# Patient Record
Sex: Female | Born: 1960 | Hispanic: Yes | Marital: Married | Smoking: Never smoker
Health system: Southern US, Community
[De-identification: ages and names within clinical notes are randomized; demographics above are authoritative.]

## PROBLEM LIST (undated history)

## (undated) DIAGNOSIS — G629 Polyneuropathy, unspecified: Secondary | ICD-10-CM

## (undated) DIAGNOSIS — E785 Hyperlipidemia, unspecified: Secondary | ICD-10-CM

## (undated) DIAGNOSIS — I10 Essential (primary) hypertension: Secondary | ICD-10-CM

## (undated) DIAGNOSIS — M8580 Other specified disorders of bone density and structure, unspecified site: Secondary | ICD-10-CM

## (undated) DIAGNOSIS — R87619 Unspecified abnormal cytological findings in specimens from cervix uteri: Secondary | ICD-10-CM

## (undated) DIAGNOSIS — G4730 Sleep apnea, unspecified: Secondary | ICD-10-CM

## (undated) DIAGNOSIS — R32 Unspecified urinary incontinence: Secondary | ICD-10-CM

## (undated) DIAGNOSIS — E119 Type 2 diabetes mellitus without complications: Secondary | ICD-10-CM

## (undated) DIAGNOSIS — A64 Unspecified sexually transmitted disease: Secondary | ICD-10-CM

## (undated) HISTORY — DX: Type 2 diabetes mellitus without complications: E11.9

## (undated) HISTORY — DX: Hyperlipidemia, unspecified: E78.5

## (undated) HISTORY — DX: Unspecified sexually transmitted disease: A64

## (undated) HISTORY — DX: Sleep apnea, unspecified: G47.30

## (undated) HISTORY — DX: Essential (primary) hypertension: I10

## (undated) HISTORY — PX: CERVICAL FUSION: SHX112

## (undated) HISTORY — PX: BREAST SURGERY: SHX581

## (undated) HISTORY — DX: Unspecified urinary incontinence: R32

## (undated) HISTORY — DX: Polyneuropathy, unspecified: G62.9

## (undated) HISTORY — DX: Unspecified abnormal cytological findings in specimens from cervix uteri: R87.619

## (undated) HISTORY — DX: Other specified disorders of bone density and structure, unspecified site: M85.80

---

## 1989-03-18 DIAGNOSIS — R87619 Unspecified abnormal cytological findings in specimens from cervix uteri: Secondary | ICD-10-CM

## 1989-03-18 HISTORY — PX: CERVICAL BIOPSY  W/ LOOP ELECTRODE EXCISION: SUR135

## 1989-03-18 HISTORY — DX: Unspecified abnormal cytological findings in specimens from cervix uteri: R87.619

## 1990-03-18 HISTORY — PX: BREAST REDUCTION SURGERY: SHX8

## 2000-03-18 HISTORY — PX: CHOLECYSTECTOMY: SHX55

## 2010-03-18 HISTORY — PX: COLONOSCOPY: SHX174

## 2012-09-15 HISTORY — PX: FOOT SURGERY: SHX648

## 2013-03-30 ENCOUNTER — Encounter: Payer: Self-pay | Admitting: Gynecology

## 2013-03-31 ENCOUNTER — Ambulatory Visit: Payer: Self-pay | Admitting: Gynecology

## 2013-03-31 ENCOUNTER — Encounter: Payer: Self-pay | Admitting: Gynecology

## 2014-01-12 ENCOUNTER — Telehealth: Payer: Self-pay | Admitting: Gynecology

## 2014-01-12 NOTE — Telephone Encounter (Signed)
LMTCB about canceled appointment with Dr. Charlies Constable.

## 2014-01-17 ENCOUNTER — Encounter: Payer: Self-pay | Admitting: Gynecology

## 2014-01-18 ENCOUNTER — Ambulatory Visit: Payer: 59 | Admitting: Gynecology

## 2014-01-21 ENCOUNTER — Encounter: Payer: Self-pay | Admitting: Obstetrics and Gynecology

## 2014-01-21 ENCOUNTER — Ambulatory Visit (INDEPENDENT_AMBULATORY_CARE_PROVIDER_SITE_OTHER): Payer: Managed Care, Other (non HMO) | Admitting: Obstetrics and Gynecology

## 2014-01-21 VITALS — BP 120/70 | HR 100 | Resp 20 | Ht 66.0 in | Wt 240.2 lb

## 2014-01-21 DIAGNOSIS — Z01419 Encounter for gynecological examination (general) (routine) without abnormal findings: Secondary | ICD-10-CM

## 2014-01-21 DIAGNOSIS — Z0000 Encounter for general adult medical examination without abnormal findings: Secondary | ICD-10-CM

## 2014-01-21 LAB — POCT URINALYSIS DIPSTICK
BILIRUBIN UA: NEGATIVE
Blood, UA: NEGATIVE
GLUCOSE UA: NEGATIVE
KETONES UA: NEGATIVE
LEUKOCYTES UA: NEGATIVE
NITRITE UA: NEGATIVE
PH UA: 5
Protein, UA: NEGATIVE
Urobilinogen, UA: NEGATIVE

## 2014-01-21 NOTE — Patient Instructions (Signed)

## 2014-01-21 NOTE — Progress Notes (Signed)
Patient ID: Donna Krause, female   DOB: 1960-05-06, 53 y.o.   MRN: [id number] 53 y.o. [id number] MarriedCaucasianF here for annual exam.   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  Has one son.  Patient is concerned about her history of condyloma and history of LEEP procedure.  Follow up paps are normal.   Has some minor leakage without warning no real stress incontinence.  Declines medication.   Hot flashes have ended.   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   PCP:  Janie Morning, MD  Patient's last menstrual period was 03/19/2007 (approximate).          Sexually active: No. female The current method of family planning is postmenopausal.    Exercising: Yes.    walking. Smoker:  no  Health Maintenance: Pap:  1-0-14 wnl:neg HR HPV History of abnormal Pap:  Yes, 1991 hx of LEEP procedure. MMG: 01-17-14 [facility]:results pending  Colonoscopy:  2010 normal in [place].  Next colonoscopy due 2020. BMD:   09/2012 revealed Osteopenia with PCP in [place].   TDaP:  Up to date with PCP Screening Labs:  Hb today: PCP, Urine today: Neg   reports that she has never smoked. She does not have any smokeless tobacco history on file. She reports that she does not drink alcohol or use illicit drugs.  Past Medical History  Diagnosis Date  . Diabetes mellitus without complication   . Sleep apnea     wears cpap  . Hypertension   . Hyperlipidemia   . Osteopenia   . STD (sexually transmitted disease)     hx. HSV and condyloma/HPV  . Abnormal Pap smear of cervix 1991    hx of LEEP procedure to cervix    Past Surgical History  Procedure Laterality Date  . Cholecystectomy  2002  . Cervical fusion    . Breast reduction surgery  1992  . Breast surgery      breast reduction  . Cervical biopsy  w/ loop electrode excision  1991  . Foot surgery  09/2012    right 5th metatarsal    Current Outpatient Prescriptions  Medication Sig Dispense Refill  . Atorvastatin Calcium (LIPITOR PO)  Take 5 mg by mouth daily.    Marland Kitchen aspirin 81 MG tablet Take 81 mg by mouth daily.    . famotidine (PEPCID) 20 MG tablet Take 20 mg by mouth 2 (two) times daily.    Marland Kitchen LANTUS SOLOSTAR 100 UNIT/ML Solostar Pen Inject 44 Units as directed at bedtime.    Marland Kitchen lisinopril (PRINIVIL,ZESTRIL) 10 MG tablet Take 10 mg by mouth daily.    . metFORMIN (GLUCOPHAGE) 1000 MG tablet Take 1,000 mg by mouth 2 (two) times daily with a meal.    . NOVOFINE 32G X 6 MM MISC     . VICTOZA 18 MG/3ML SOPN Inject 1.8 mg as directed at bedtime.     No current facility-administered medications for this visit.    Family History  Problem Relation Age of Onset  . Thyroid disease Mother   . Diabetes Father     Type 2   . Heart attack Father 70  . Heart failure Father   . Rheumatic fever Brother     ROS:  Pertinent items are noted in HPI.  Otherwise, a comprehensive ROS was negative.  Exam:   BP 120/70 mmHg  Pulse 100  Resp 20  Ht 5\' 6"  (1.676 m)  Wt 240 lb 3.2 oz (108.954 kg)  BMI 38.79 kg/m2  LMP 03/19/2007 (  Approximate)     Height: 5\' 6"  (167.6 cm)  Ht Readings from Last 3 Encounters:  01/21/14 5\' 6"  (1.676 m)    General appearance: alert, cooperative and appears stated age Head: Normocephalic, without obvious abnormality, atraumatic Neck: no adenopathy, supple, symmetrical, trachea midline and thyroid normal to inspection and palpation Lungs: clear to auscultation bilaterally Breasts: normal appearance, no masses or tenderness Heart: regular rate and rhythm Abdomen: central obesity, soft, non-tender; bowel sounds normal; no masses,  no organomegaly Extremities: extremities normal, atraumatic, no cyanosis or edema Skin: Skin color, texture, turgor normal. No rashes or lesions Lymph nodes: Cervical, supraclavicular, and axillary nodes normal. No abnormal inguinal nodes palpated Neurologic: Grossly normal   Pelvic: External genitalia:  no lesions              Urethra:  normal appearing urethra with no  masses, tenderness or lesions              Bartholins and Skenes: normal                 Vagina: normal appearing vagina with normal color and discharge, no lesions              Cervix: no lesions              Pap taken: Yes.   Bimanual Exam:  Uterus:  normal size, contour, position, consistency, mobility, non-tender              Adnexa: normal adnexa and no mass, fullness, tenderness               Rectovaginal: Confirms               Anus:  normal sphincter tone, no lesions  A:  Well Woman with normal exam History of condyloma and LEEP. Osteopenia.  Urinary incontinence. Possible overactive bladder.  P:   Mammogram yearly. pap smear and HR HPV performed. Discussed Ca/Vit D. Declines antimuscarinic or anticholinergic medication for bladder control.  return annually or prn  An After Visit Summary was printed and given to the patient.

## 2014-01-26 LAB — IPS PAP TEST WITH HPV

## 2015-03-07 ENCOUNTER — Encounter: Payer: Self-pay | Admitting: Obstetrics and Gynecology

## 2015-03-07 ENCOUNTER — Ambulatory Visit (INDEPENDENT_AMBULATORY_CARE_PROVIDER_SITE_OTHER): Payer: BLUE CROSS/BLUE SHIELD | Admitting: Obstetrics and Gynecology

## 2015-03-07 VITALS — BP 110/72 | HR 80 | Resp 14 | Ht 66.0 in | Wt 243.8 lb

## 2015-03-07 DIAGNOSIS — B372 Candidiasis of skin and nail: Secondary | ICD-10-CM

## 2015-03-07 DIAGNOSIS — R319 Hematuria, unspecified: Secondary | ICD-10-CM

## 2015-03-07 DIAGNOSIS — Z01419 Encounter for gynecological examination (general) (routine) without abnormal findings: Secondary | ICD-10-CM | POA: Diagnosis not present

## 2015-03-07 DIAGNOSIS — Z0000 Encounter for general adult medical examination without abnormal findings: Secondary | ICD-10-CM | POA: Diagnosis not present

## 2015-03-07 DIAGNOSIS — N3281 Overactive bladder: Secondary | ICD-10-CM | POA: Diagnosis not present

## 2015-03-07 MED ORDER — OXYBUTYNIN CHLORIDE ER 10 MG PO TB24: 10.0000 mg | ORAL_TABLET | Freq: Every day | ORAL | Status: DC

## 2015-03-07 MED ORDER — FLUCONAZOLE 150 MG PO TABS: 150.0000 mg | ORAL_TABLET | Freq: Once | ORAL | Status: DC

## 2015-03-07 MED ORDER — NYSTATIN 100000 UNIT/GM EX POWD: Freq: Three times a day (TID) | CUTANEOUS | Status: DC

## 2015-03-07 NOTE — Progress Notes (Addendum)
Patient ID: Donna Krause, female   DOB: 10-Dec-1960, 54 y.o.   MRN: [id number] 54 y.o. [id number] Married Caucasian female here for annual exam.    Struggling with weight.  Stress eating.  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  101 year old son.   Wants pap today.    Patient does complain of urinary frequency and urinary incontinence.  Wears a pad. Voiding twice per night.  No stress incontinence.  Coffee - 16 ounces per am.   She also states she has a possible yeast rash in groin area and request prescription cream for this. Using antifungal creams.   No glaucoma.   PCP:  Janie Morning, MD   Patient's last menstrual period was 03/19/2007 (approximate).          Sexually active: No. female The current method of family planning is post menopausal status.    Exercising: No.   Smoker:  no  Health Maintenance: Pap:  01-24-14 Neg:Neg HR HPV History of abnormal Pap:  Yes, 1991 hx LEEP procedure. MMG:  03-01-15 normal per patient:[facility]. Colonoscopy:  2010 normal in [place] due 2020. BMD:   09/2012  Result  Osteopenia with PCP.  Mild per patient.  TDaP:  PCP Screening Labs:  Hb today: PCP, Urine today:  Trace RBCs.   reports that she has never smoked. She does not have any smokeless tobacco history on file. She reports that she does not drink alcohol or use illicit drugs.  Past Medical History  Diagnosis Date  . Diabetes mellitus without complication ([place])   . Sleep apnea     wears cpap  . Hypertension   . Hyperlipidemia   . Osteopenia   . STD (sexually transmitted disease)     hx. HSV and condyloma/HPV  . Abnormal Pap smear of cervix 1991    hx of LEEP procedure to cervix  . Urinary incontinence     Past Surgical History  Procedure Laterality Date  . Cholecystectomy  2002  . Cervical fusion    . Breast reduction surgery  1992  . Breast surgery      breast reduction  . Cervical biopsy  w/ loop electrode excision  1991  . Foot  surgery  09/2012    right 5th metatarsal    Current Outpatient Prescriptions  Medication Sig Dispense Refill  . aspirin 81 MG tablet Take 81 mg by mouth daily.    . famotidine (PEPCID) 20 MG tablet Take 20 mg by mouth 2 (two) times daily.    Marland Kitchen LANTUS SOLOSTAR 100 UNIT/ML Solostar Pen Inject 44 Units as directed at bedtime.    Marland Kitchen lisinopril (PRINIVIL,ZESTRIL) 10 MG tablet Take 10 mg by mouth daily.    Marland Kitchen NOVOFINE 32G X 6 MM MISC     . VICTOZA 18 MG/3ML SOPN Inject 1.8 mg as directed at bedtime.    . metFORMIN (GLUCOPHAGE) 500 MG tablet Take 2 tablets by mouth 2 (two) times daily.    . ONE TOUCH ULTRA TEST test strip     . rosuvastatin (CRESTOR) 10 MG tablet Take 1 tablet by mouth daily.     No current facility-administered medications for this visit.    Family History  Problem Relation Age of Onset  . Thyroid disease Mother   . Diabetes Father     Type 2   . Heart attack Father 61  . Heart failure Father   . Rheumatic fever Brother     ROS:  Pertinent items are noted in  HPI.  Otherwise, a comprehensive ROS was negative.  Exam:   BP 110/72 mmHg  Pulse 80  Resp 14  Ht 5\' 6"  (1.676 m)  Wt 243 lb 12.8 oz (110.587 kg)  BMI 39.37 kg/m2  LMP 03/19/2007 (Approximate)    General appearance: alert, cooperative and appears stated age Head: Normocephalic, without obvious abnormality, atraumatic Neck: no adenopathy, supple, symmetrical, trachea midline and thyroid normal to inspection and palpation Lungs: clear to auscultation bilaterally Breasts: normal appearance, no masses or tenderness, Inspection negative, No nipple retraction or dimpling, No nipple discharge or bleeding, No axillary or supraclavicular adenopathy.  Scars consistent with bilateral breast reduction.  Heart: regular rate and rhythm Abdomen: central obesity, soft, non-tender; bowel sounds normal; no masses,  no organomegaly Extremities: extremities normal, atraumatic, no cyanosis or edema Skin: Skin color, texture,  turgor normal. No rashes or lesions Lymph nodes: Cervical, supraclavicular, and axillary nodes normal. No abnormal inguinal nodes palpated Neurologic: Grossly normal  Pelvic: External genitalia:  Left groin flexural skin with creamy drainage and mild whitish change of the skin.               Urethra:  normal appearing urethra with no masses, tenderness or lesions              Bartholins and Skenes: normal                 Vagina: normal appearing vagina with normal color and discharge, no lesions              Cervix: no lesions              Pap taken: Yes.   Bimanual Exam:  Uterus:  normal size, contour, position, consistency, mobility, non-tender              Adnexa: normal adnexa and no mass, fullness, tenderness              Rectovaginal: Yes.  .  Confirms.              Anus:  normal sphincter tone, no lesions  Chaperone was present for exam.  Assessment:   Well woman visit with normal exam. History of condyloma and LEEP. Osteopenia.  Urinary incontinence. Overactive bladder. Microscopic hematuria.  Candida of flexural skin.  Plan: Yearly mammogram recommended after age 75.  Recommended self breast exam.  Pap and HR HPV as above. Discussed Calcium, Vitamin D, regular exercise program including cardiovascular and weight bearing exercise. Labs performed.  No..   Urine micro and culture. Refills given on medications.  Yes.  .  See orders.  Ditropan XL 10 mg daily. #30, RF one.  Discussed side effects of dry mouth, constipation, and urinary retention.  Patient will report back how she is doing with the medication, and we will then send in refills to her mail order pharmacy.  Diflucan 150 mg. #2, RF none.  Nystatin powder with refills.  Encouraged time for herself.  Shared ideas about tasking others with responsibilities. Follow up annually and prn.      After visit summary provided.

## 2015-03-07 NOTE — Patient Instructions (Signed)

## 2015-03-08 LAB — URINALYSIS, MICROSCOPIC ONLY
BACTERIA UA: NONE SEEN [HPF]
Casts: NONE SEEN [LPF]
Crystals: NONE SEEN [HPF]
RBC / HPF: NONE SEEN RBC/HPF (ref <=2)
WBC UA: NONE SEEN WBC/HPF (ref <=5)
Yeast: NONE SEEN [HPF]

## 2015-03-09 ENCOUNTER — Telehealth: Payer: Self-pay

## 2015-03-09 LAB — URINE CULTURE: Colony Count: 6000

## 2015-03-09 LAB — IPS PAP TEST WITH HPV

## 2015-03-09 NOTE — Telephone Encounter (Signed)
-----   Message from Salvadore Dom, MD sent at 03/09/2015  1:22 PM EST ----- Please advise the patient of normal results.

## 2015-03-09 NOTE — Telephone Encounter (Signed)
Spoke with patient. Results given as seen below. Patient is agreeable.  Routing to provider for final review. Patient agreeable to disposition. Will close encounter.

## 2015-04-27 DIAGNOSIS — E559 Vitamin D deficiency, unspecified: Secondary | ICD-10-CM | POA: Insufficient documentation

## 2015-04-27 DIAGNOSIS — E1165 Type 2 diabetes mellitus with hyperglycemia: Secondary | ICD-10-CM | POA: Insufficient documentation

## 2015-04-27 DIAGNOSIS — Z794 Long term (current) use of insulin: Secondary | ICD-10-CM

## 2015-04-27 DIAGNOSIS — G4733 Obstructive sleep apnea (adult) (pediatric): Secondary | ICD-10-CM | POA: Insufficient documentation

## 2015-05-05 ENCOUNTER — Other Ambulatory Visit: Payer: Self-pay | Admitting: Obstetrics and Gynecology

## 2015-05-05 DIAGNOSIS — N3281 Overactive bladder: Secondary | ICD-10-CM

## 2015-05-05 NOTE — Telephone Encounter (Signed)
Pt is requesting oxybutynin 10mg  1tablet po qhs to be refilled.   Last Annual: 03/07/2015 Next Annual: Not scheduled  Last Filled: 03/07/2015 Last Mammo: 03/01/15- Category A, BI-RADS 1 Neg  Please advise on approval or denial. Thank you!

## 2017-01-09 DIAGNOSIS — I10 Essential (primary) hypertension: Secondary | ICD-10-CM | POA: Insufficient documentation

## 2017-01-10 ENCOUNTER — Ambulatory Visit (INDEPENDENT_AMBULATORY_CARE_PROVIDER_SITE_OTHER): Payer: 59

## 2017-01-10 ENCOUNTER — Encounter: Payer: Self-pay | Admitting: Sports Medicine

## 2017-01-10 ENCOUNTER — Ambulatory Visit (INDEPENDENT_AMBULATORY_CARE_PROVIDER_SITE_OTHER): Payer: 59 | Admitting: Sports Medicine

## 2017-01-10 VITALS — BP 113/72 | HR 90 | Ht 66.0 in | Wt 230.0 lb

## 2017-01-10 DIAGNOSIS — M79675 Pain in left toe(s): Secondary | ICD-10-CM

## 2017-01-10 DIAGNOSIS — S93505A Unspecified sprain of left lesser toe(s), initial encounter: Secondary | ICD-10-CM | POA: Diagnosis not present

## 2017-01-10 DIAGNOSIS — E119 Type 2 diabetes mellitus without complications: Secondary | ICD-10-CM | POA: Diagnosis not present

## 2017-01-10 DIAGNOSIS — B351 Tinea unguium: Secondary | ICD-10-CM | POA: Diagnosis not present

## 2017-01-10 DIAGNOSIS — S92354D Nondisplaced fracture of fifth metatarsal bone, right foot, subsequent encounter for fracture with routine healing: Secondary | ICD-10-CM | POA: Insufficient documentation

## 2017-01-10 NOTE — Progress Notes (Signed)
° °  Subjective:    Patient ID: Donna Krause, female    DOB: 14-Jul-1960, 56 y.o.   MRN: [id number]  HPI    Review of Systems  Constitutional: Negative.   HENT: Negative.   Eyes: Negative.   Respiratory: Negative.   Cardiovascular: Negative.   Gastrointestinal: Negative.   Endocrine: Negative.   Genitourinary: Negative.   Musculoskeletal: Negative.   Skin: Negative.   Allergic/Immunologic: Negative.   Neurological: Negative.   Hematological: Negative.   Psychiatric/Behavioral: Negative.        Objective:   Physical Exam        Assessment & Plan:

## 2017-01-10 NOTE — Progress Notes (Signed)
Subjective: Donna Krause is a 56 y.o. female patient seen today in office with complaint of 1. Pain at the left second toe after injury of catching her toe when she was stooping down and bruising the toe on October 7 agent states that since then she has had episode of bruising and some numbness only to the second toe itself. Patient has tried splinting to toes together, which has helped. However, was concerned because of the bruising and wanted to have it further evaluated. Patient also complains of 2. painful thickened and discolored nails, especially at the left first toe. Patient is desiring treatment for nail changes; has tried OTC topicals/Medication in the past with no improvement. Reports that nails are becoming difficult to manage because of the thickness. Patient has no other pedal complaints at this time.  A1c 7.9   Review of systems all systems reviewed and are negative.  There are no active problems to display for this patient.   Current Outpatient Prescriptions on File Prior to Visit  Medication Sig Dispense Refill  . aspirin 81 MG tablet Take 81 mg by mouth daily.    . famotidine (PEPCID) 20 MG tablet Take 20 mg by mouth 2 (two) times daily.    Marland Kitchen lisinopril (PRINIVIL,ZESTRIL) 10 MG tablet Take 10 mg by mouth daily.    . metFORMIN (GLUCOPHAGE) 500 MG tablet Take 2 tablets by mouth 2 (two) times daily.    . ONE TOUCH ULTRA TEST test strip     . rosuvastatin (CRESTOR) 10 MG tablet Take 1 tablet by mouth daily.    Marland Kitchen VICTOZA 18 MG/3ML SOPN Inject 1.8 mg as directed at bedtime.    . fluconazole (DIFLUCAN) 150 MG tablet Take 1 tablet (150 mg total) by mouth once. May repeat in 48 hours if needed. (Patient not taking: Reported on 01/10/2017) 2 tablet 0  . LANTUS SOLOSTAR 100 UNIT/ML Solostar Pen Inject 44 Units as directed at bedtime.    Marland Kitchen NOVOFINE 32G X 6 MM MISC     . nystatin (MYCOSTATIN) powder Apply topically 3 (three) times daily. Apply to affected area for up to 7 days (Patient  not taking: Reported on 01/10/2017) 30 g 3  . oxybutynin (DITROPAN-XL) 10 MG 24 hr tablet TAKE 1 TABLET (10 MG TOTAL) BY MOUTH AT BEDTIME. (Patient not taking: Reported on 01/10/2017) 30 tablet 9   No current facility-administered medications on file prior to visit.     No Known Allergies  Objective: Physical Exam  General: Well developed, nourished, no acute distress, awake, alert and oriented x 3  Vascular: Dorsalis pedis artery 1/4 bilateral, Posterior tibial artery 1/4 bilateral, skin temperature warm to warm proximal to distal bilateral lower extremities, no varicosities, pedal hair present bilateral.  Neurological: Gross sensation present via light touch bilateral. Protective and epicritic intact except at left second toe where there is numbness after injury.  Dermatological: Skin is warm, dry, and supple bilateral, Nails 1-10 are tender, short thick, and discolored with mild subungal debris with left first toe most involved, no webspace macerations present bilateral, no open lesions present bilateral, mild callus at bunion present left greater than right. No signs of infection bilateral.  Musculoskeletal: Bunion and hammertoe deformity with medial deviation of the left second toe and mild swelling at the metatarsophalangeal joint with no residual ecchymosis secondary to injury. Muscular strength within normal limits without painon range of motion. No pain with calf compression bilateral.  Assessment and Plan:  Problem List Items Addressed This Visit  None    Visit Diagnoses    Sprain of lesser toe of left foot, initial encounter    -  Primary   Relevant Orders   DG Foot Complete Left   Toe pain, left       Dermatophytosis of nail       Relevant Orders   Fungus Culture with Smear   Diabetes mellitus without complication (HCC)       Relevant Medications   insulin detemir (LEVEMIR) 100 UNIT/ML injection     -Examined patient -X-rays reviewed. No fracture or dislocation.  Positive bunion and hammertoe deformity with medial subluxation of the left second toe -Discussed treatment options for  likely left second metatarsophalangeal joint sprain and painful dystrophic nails  -Advised patient to continue with buddy splinting the toes at least for the next 2 weeks. Advised patient to continue with stiff soled shoe and dispensed postoperative shoe to use as well to provide additional protection to left second metatarsophalangeal joint. Advised patient expect her to be able to splint the toes for the next 2-4 weeks. -Advised rest, ice, elevation until symptoms resolve and to refrain from high impact exercises to prevent re-bruising or recent spraining the left second metatarsal phalangeal joint. -Fungal culture was obtained  from all nails, but especially the left first toenail and sent to [facility] lab -Patient to return in 2 weeks for follow up evaluation and discussion of fungal culture results or sooner if symptoms worsen.  Landis Martins, DPM

## 2017-01-21 ENCOUNTER — Other Ambulatory Visit: Payer: Self-pay | Admitting: Sports Medicine

## 2017-01-21 DIAGNOSIS — S93505A Unspecified sprain of left lesser toe(s), initial encounter: Secondary | ICD-10-CM

## 2017-01-24 ENCOUNTER — Encounter: Payer: Self-pay | Admitting: Sports Medicine

## 2017-01-24 ENCOUNTER — Ambulatory Visit: Payer: 59 | Admitting: Sports Medicine

## 2017-01-24 DIAGNOSIS — B351 Tinea unguium: Secondary | ICD-10-CM | POA: Diagnosis not present

## 2017-01-24 DIAGNOSIS — E119 Type 2 diabetes mellitus without complications: Secondary | ICD-10-CM

## 2017-01-24 DIAGNOSIS — S93505D Unspecified sprain of left lesser toe(s), subsequent encounter: Secondary | ICD-10-CM

## 2017-01-24 DIAGNOSIS — M79675 Pain in left toe(s): Secondary | ICD-10-CM

## 2017-01-24 NOTE — Progress Notes (Signed)
Subjective: Donna Krause is a 56 y.o. diabetic female patient seen today in office for follow-up evaluation of left second toe pain and for discussion of fungal culture results if available.  Patient also admits that after taping the toe she developed some blistering so stopped and states that she has been occasionally having some pain with first few steps out of bed in the morning however she did change shoes which helped.  Patient denies any other pedal concerns at this time.  There are no active problems to display for this patient.   Current Outpatient Medications on File Prior to Visit  Medication Sig Dispense Refill  . aspirin 81 MG tablet Take 81 mg by mouth daily.    . calcium citrate-vitamin D (CITRACAL+D) 315-200 MG-UNIT tablet Take 1 tablet by mouth 2 (two) times daily.    . Cholecalciferol (VITAMIN D3) 3000 units TABS Take by mouth.    . famotidine (PEPCID) 20 MG tablet Take 20 mg by mouth 2 (two) times daily.    . fluconazole (DIFLUCAN) 150 MG tablet Take 1 tablet (150 mg total) by mouth once. May repeat in 48 hours if needed. (Patient not taking: Reported on 01/10/2017) 2 tablet 0  . glucosamine-chondroitin 500-400 MG tablet Take 1 tablet by mouth 3 (three) times daily.    . insulin detemir (LEVEMIR) 100 UNIT/ML injection Inject into the skin at bedtime.    Marland Kitchen LANTUS SOLOSTAR 100 UNIT/ML Solostar Pen Inject 44 Units as directed at bedtime.    Marland Kitchen lisinopril (PRINIVIL,ZESTRIL) 10 MG tablet Take 10 mg by mouth daily.    . metFORMIN (GLUCOPHAGE) 500 MG tablet Take 2 tablets by mouth 2 (two) times daily.    . Multiple Vitamin (MULTIVITAMIN) tablet Take 1 tablet by mouth daily.    Marland Kitchen NOVOFINE 32G X 6 MM MISC     . nystatin (MYCOSTATIN) powder Apply topically 3 (three) times daily. Apply to affected area for up to 7 days (Patient not taking: Reported on 01/10/2017) 30 g 3  . ONE TOUCH ULTRA TEST test strip     . oxybutynin (DITROPAN-XL) 10 MG 24 hr tablet TAKE 1 TABLET (10 MG TOTAL) BY  MOUTH AT BEDTIME. (Patient not taking: Reported on 01/10/2017) 30 tablet 9  . rosuvastatin (CRESTOR) 10 MG tablet Take 1 tablet by mouth daily.    Marland Kitchen VICTOZA 18 MG/3ML SOPN Inject 1.8 mg as directed at bedtime.    . vitamin C (ASCORBIC ACID) 500 MG tablet Take 500 mg by mouth daily.     No current facility-administered medications on file prior to visit.     No Known Allergies  Objective: Physical Exam  General: Well developed, nourished, no acute distress, awake, alert and oriented x 3  Vascular: Dorsalis pedis artery 1/4 bilateral, Posterior tibial artery 1/4 bilateral, skin temperature warm to warm proximal to distal bilateral lower extremities, no varicosities, pedal hair present bilateral.  Neurological: Gross sensation present via light touch bilateral. Protective and epicritic intact except at left second toe where there is numbness after injury.  Dermatological: Skin is warm, dry, and supple bilateral, Nails 1-10 are tender, short thick, and discolored with mild subungal debris with left first toe most involved, no webspace macerations present bilateral, no open lesions present bilateral, mild callus at bunion present left greater than right. No signs of infection bilateral.  No blisters bilateral.  Musculoskeletal: Bunion and hammertoe deformity with medial deviation of the left second toe and resolved swelling at the metatarsophalangeal joint.  No reproducible tenderness to  palpation at the plantar fascial insertion however subjectively patient states that she occasionally gets pain here.  Pes planus foot type bilateral.  Muscular strength within normal limits without painon range of motion. No pain with calf compression bilateral.  Assessment and Plan:  Problem List Items Addressed This Visit    None    Visit Diagnoses    Sprain of lesser toe of left foot, subsequent encounter    -  Primary   Toe pain, left       Dermatophytosis of nail       Diabetes mellitus without  complication ([place])         -Examined patient -Discussed continued care for her left second metatarsal phalangeal joint sprain advised patient to continue with stiff soled tennis shoe and postoperative shoe as needed advised patient to discontinue buddy splinting since blistering is present however if she does decide to resume buddy splinting advised patient to splint the second toe to the third toe and not the first toe to avoid shear blisters. -Encourage patient gentle stretching, icing, and continue with good supportive shoes to prevent flareup of plantar fasciitis -Pathology results from [facility] from fungal culture were not available at this visit will call patient with results for further treatment options patient is interested in laser. -Patient to return as needed or sooner if symptoms worsen.  Landis Martins, DPM

## 2017-01-24 NOTE — Patient Instructions (Signed)

## 2017-01-29 ENCOUNTER — Telehealth: Payer: Self-pay | Admitting: Sports Medicine

## 2017-01-29 NOTE — Telephone Encounter (Signed)
Dr. Cannon Kettle just called me and left me a message that she has the results of my culture. I am calling back to get those results. The best number to reach me at is [phone]. Thank you.

## 2017-01-29 NOTE — Telephone Encounter (Signed)
Donna Krause Can you attempt to reach the patient back to let her know her treatment options for fungus that she has from the + culture. Oral (80% effective)- if she wants to do this then send order for LFTs and we will call with blood work results and start medication, Laser (75% effective)- if she wants this then schedule appt for laser with jessica q, or Topical (20% effective)- if she wants this then send Rx to [facility] for topical antifungal. Thanks Dr. Cannon Kettle

## 2017-01-29 NOTE — Telephone Encounter (Signed)
Entered in error

## 2017-01-29 NOTE — Telephone Encounter (Signed)
Phone call was made to patient to discuss fungal culture results. Patient did not answer home or cell. Left message for patient to call office back to discuss results.  Patient's nail culture is + for fungus (T Rubrum). Treatments for this are laser, oral lamisil, and or topical with manual debridement. Will discuss these options with the patient and await her decision on what she desires to do for the nail fungus.   -Dr. Cannon Kettle

## 2017-01-30 NOTE — Telephone Encounter (Signed)
Thank you :)

## 2017-01-30 NOTE — Telephone Encounter (Signed)
I explained treatment options as recommended by Dr. Cannon Kettle and % of effectiveness. Pt states she would like to have laser treatment. I explained it would be $100.00/treatment and up to 5-6 treatments. Transferred pt to schedulers for scheduling.

## 2017-02-03 ENCOUNTER — Ambulatory Visit (INDEPENDENT_AMBULATORY_CARE_PROVIDER_SITE_OTHER): Payer: 59 | Admitting: Sports Medicine

## 2017-02-03 DIAGNOSIS — B351 Tinea unguium: Secondary | ICD-10-CM

## 2017-02-10 NOTE — Progress Notes (Signed)
Pt presents with mycotic infection of nails  All other systems are negative  Laser therapy administered to affected nails and tolerated well. All safety precautions were in place. Re-appointed in 4 weeks for 3rd treatment

## 2017-02-11 ENCOUNTER — Encounter: Payer: Self-pay | Admitting: Sports Medicine

## 2017-02-11 NOTE — Progress Notes (Signed)
Agree with nurse note -Dr. Cannon Kettle

## 2017-02-18 DIAGNOSIS — Z1239 Encounter for other screening for malignant neoplasm of breast: Secondary | ICD-10-CM | POA: Insufficient documentation

## 2017-03-07 ENCOUNTER — Ambulatory Visit: Payer: 59

## 2017-03-24 ENCOUNTER — Ambulatory Visit: Payer: Self-pay | Admitting: Podiatry

## 2017-03-24 DIAGNOSIS — B351 Tinea unguium: Secondary | ICD-10-CM

## 2017-03-31 NOTE — Progress Notes (Signed)
Pt presents with mycotic infection of nails 1-5 bilateral  All other systems are negative  Laser therapy administered to affected nails and tolerated well. All safety precautions were in place. Re-appointed in 4 weeks for 2nd treatment 

## 2017-04-21 ENCOUNTER — Other Ambulatory Visit (HOSPITAL_COMMUNITY)
Admission: RE | Admit: 2017-04-21 | Discharge: 2017-04-21 | Disposition: A | Discharge status: Home / Self Care | Payer: 59 | Source: Ambulatory Visit | Attending: Obstetrics and Gynecology | Admitting: Obstetrics and Gynecology

## 2017-04-21 ENCOUNTER — Encounter: Payer: Self-pay | Admitting: Obstetrics and Gynecology

## 2017-04-21 ENCOUNTER — Ambulatory Visit: Payer: 59 | Admitting: Obstetrics and Gynecology

## 2017-04-21 ENCOUNTER — Other Ambulatory Visit: Payer: Self-pay

## 2017-04-21 ENCOUNTER — Ambulatory Visit: Payer: Self-pay | Admitting: Sports Medicine

## 2017-04-21 VITALS — BP 112/62 | HR 88 | Resp 16 | Ht 65.75 in | Wt 238.0 lb

## 2017-04-21 DIAGNOSIS — N3946 Mixed incontinence: Secondary | ICD-10-CM | POA: Diagnosis not present

## 2017-04-21 DIAGNOSIS — Z01419 Encounter for gynecological examination (general) (routine) without abnormal findings: Secondary | ICD-10-CM

## 2017-04-21 DIAGNOSIS — B351 Tinea unguium: Secondary | ICD-10-CM

## 2017-04-21 DIAGNOSIS — Z87312 Personal history of (healed) stress fracture: Secondary | ICD-10-CM | POA: Diagnosis not present

## 2017-04-21 DIAGNOSIS — M8580 Other specified disorders of bone density and structure, unspecified site: Secondary | ICD-10-CM | POA: Diagnosis not present

## 2017-04-21 MED ORDER — NYSTATIN 100000 UNIT/GM EX POWD: Freq: Three times a day (TID) | CUTANEOUS | 3 refills | Status: DC

## 2017-04-21 NOTE — Progress Notes (Signed)
Pt presents with mycotic infection of nails 1-5 bilateral  All other systems are negative  Laser therapy administered to affected nails and tolerated well. All safety precautions were in place. Re-appointed in 4 weeks for 4th treatment 

## 2017-04-21 NOTE — Patient Instructions (Signed)
You may consider Dr. Suzette Battiest or Dr. Dwyane Dee for endocrinology care.   EXERCISE AND DIET:  We recommended that you start or continue a regular exercise program for good health. Regular exercise means any activity that makes your heart beat faster and makes you sweat.  We recommend exercising at least 30 minutes per day at least 3 days a week, preferably 4 or 5.  We also recommend a diet low in fat and sugar.  Inactivity, poor dietary choices and obesity can cause diabetes, heart attack, stroke, and kidney damage, among others.    ALCOHOL AND SMOKING:  Women should limit their alcohol intake to no more than 7 drinks/beers/glasses of wine (combined, not each!) per week. Moderation of alcohol intake to this level decreases your risk of breast cancer and liver damage. And of course, no recreational drugs are part of a healthy lifestyle.  And absolutely no smoking or even second hand smoke. Most people know smoking can cause heart and lung diseases, but did you know it also contributes to weakening of your bones? Aging of your skin?  Yellowing of your teeth and nails?  CALCIUM AND VITAMIN D:  Adequate intake of calcium and Vitamin D are recommended.  The recommendations for exact amounts of these supplements seem to change often, but generally speaking 600 mg of calcium (either carbonate or citrate) and 800 units of Vitamin D per day seems prudent. Certain women may benefit from higher intake of Vitamin D.  If you are among these women, your doctor will have told you during your visit.    PAP SMEARS:  Pap smears, to check for cervical cancer or precancers,  have traditionally been done yearly, although recent scientific advances have shown that most women can have pap smears less often.  However, every woman still should have a physical exam from her gynecologist every year. It will include a breast check, inspection of the vulva and vagina to check for abnormal growths or skin changes, a visual exam of the cervix,  and then an exam to evaluate the size and shape of the uterus and ovaries.  And after 59 years of age, a rectal exam is indicated to check for rectal cancers. We will also provide age appropriate advice regarding health maintenance, like when you should have certain vaccines, screening for sexually transmitted diseases, bone density testing, colonoscopy, mammograms, etc.   MAMMOGRAMS:  All women over 71 years old should have a yearly mammogram. Many facilities now offer a "3D" mammogram, which may cost around $50 extra out of pocket. If possible,  we recommend you accept the option to have the 3D mammogram performed.  It both reduces the number of women who will be called back for extra views which then turn out to be normal, and it is better than the routine mammogram at detecting truly abnormal areas.    COLONOSCOPY:  Colonoscopy to screen for colon cancer is recommended for all women at age 40.  We know, you hate the idea of the prep.  We agree, BUT, having colon cancer and not knowing it is worse!!  Colon cancer so often starts as a polyp that can be seen and removed at colonscopy, which can quite literally save your life!  And if your first colonoscopy is normal and you have no family history of colon cancer, most women don't have to have it again for 10 years.  Once every ten years, you can do something that may end up saving your life, right?  We  will be happy to help you get it scheduled when you are ready.  Be sure to check your insurance coverage so you understand how much it will cost.  It may be covered as a preventative service at no cost, but you should check your particular policy.

## 2017-04-21 NOTE — Progress Notes (Signed)
57 y.o. G23P1011 Married Caucasian female here for annual exam.    Urinary incontinence with cough/sneeze and spontaneously. Night time urination.   Difficult to control DM.  Dizziness with [place].  Stopped using Ditropan due to a UTI.  Just uses a pad now for incontinence.   Taking Citracal 500 mg once daily.   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  ROS also positive for muscle/joint weakness.  Thinks it may be arthritis.     PCP:   Teressa Lower, MD  Patient's last menstrual period was 03/19/2007 (approximate).           Sexually active: No.  The current method of family planning is post menopausal status.    Exercising: Yes.    walking and weights Smoker:  no  Health Maintenance: Pap: 03/07/15 Neg:Neg HR HPV  01-24-14 Neg:Neg HR HPV History of abnormal Pap:  Yes, 1991 hx LEEP procedure MMG:  03/13/17 Normal - [facility] Colonoscopy:  2012 normal in [place] per patient.   Did Cologuard 2017 and this was normal. BMD:   09/2012  Result  Osteopenia per patient  TDaP:  PCP HIV and Hep C: 08/07/06 Negative Screening Labs: PCP   reports that  has never smoked. she has never used smokeless tobacco. She reports that she does not drink alcohol or use drugs.  Past Medical History:  Diagnosis Date  . Abnormal Pap smear of cervix 1991   hx of LEEP procedure to cervix  . Diabetes mellitus without complication ([place])   . Hyperlipidemia   . Hypertension   . Osteopenia   . Sleep apnea    wears cpap  . STD (sexually transmitted disease)    hx. HSV and condyloma/HPV  . Urinary incontinence     Past Surgical History:  Procedure Laterality Date  . BREAST REDUCTION SURGERY  1992  . BREAST SURGERY     breast reduction  . CERVICAL BIOPSY  W/ LOOP ELECTRODE EXCISION  1991  . CERVICAL FUSION    . CHOLECYSTECTOMY  2002  . FOOT SURGERY  09/2012   right 5th metatarsal    Current Outpatient Medications  Medication Sig Dispense Refill  . aspirin  81 MG tablet Take 81 mg by mouth daily.    . calcium citrate-vitamin D (CITRACAL+D) 315-200 MG-UNIT tablet Take 1 tablet by mouth 2 (two) times daily.    . Cholecalciferol (VITAMIN D3) 3000 units TABS Take by mouth.    . famotidine (PEPCID) 20 MG tablet Take 20 mg by mouth 2 (two) times daily.    Marland Kitchen glucosamine-chondroitin 500-400 MG tablet Take 1 tablet by mouth 3 (three) times daily.    . insulin detemir (LEVEMIR) 100 UNIT/ML injection Inject 76 Units into the skin at bedtime.     Marland Kitchen lisinopril (PRINIVIL,ZESTRIL) 10 MG tablet Take 10 mg by mouth daily.    . metFORMIN (GLUCOPHAGE) 500 MG tablet Take 2 tablets by mouth 2 (two) times daily.    . Multiple Vitamin (MULTIVITAMIN) tablet Take 1 tablet by mouth daily.    Marland Kitchen NOVOFINE 32G X 6 MM MISC     . NOVOLOG FLEXPEN 100 UNIT/ML FlexPen as needed.    . ONE TOUCH ULTRA TEST test strip     . rosuvastatin (CRESTOR) 10 MG tablet Take 1 tablet by mouth daily.    Marland Kitchen VICTOZA 18 MG/3ML SOPN Inject 1.8 mg as directed at bedtime.    . vitamin C (ASCORBIC ACID) 500 MG tablet Take 500 mg by  mouth daily.    Marland Kitchen nystatin (MYCOSTATIN) powder Apply topically 3 (three) times daily. Apply to affected area for up to 7 days (Patient not taking: Reported on 04/21/2017) 30 g 3   No current facility-administered medications for this visit.     Family History  Problem Relation Age of Onset  . Thyroid disease Mother   . Diabetes Father        Type 2   . Heart attack Father 88  . Heart failure Father   . Rheumatic fever Brother     ROS:  Pertinent items are noted in HPI.  Otherwise, a comprehensive ROS was negative.  Exam:   BP 112/62 (BP Location: Right Arm, Patient Position: Sitting, Cuff Size: Normal)   Pulse 88   Resp 16   Ht 5' 5.75" (1.67 m)   Wt 238 lb (108 kg)   LMP 03/19/2007 (Approximate)   BMI 38.71 kg/m     General appearance: alert, cooperative and appears stated age Head: Normocephalic, without obvious abnormality, atraumatic Neck: no adenopathy,  supple, symmetrical, trachea midline and thyroid normal to inspection and palpation Lungs: clear to auscultation bilaterally Breasts: normal appearance, no masses or tenderness, No nipple retraction or dimpling, No nipple discharge or bleeding, No axillary or supraclavicular adenopathy Heart: regular rate and rhythm Abdomen: central obesity, soft, non-tender; no masses, no organomegaly Extremities: extremities normal, atraumatic, no cyanosis or edema Skin: Skin color, texture, turgor normal. No rashes or lesions Lymph nodes: Cervical, supraclavicular, and axillary nodes normal. No abnormal inguinal nodes palpated Neurologic: Grossly normal  Pelvic: External genitalia:  no lesions              Urethra:  normal appearing urethra with no masses, tenderness or lesions              Bartholins and Skenes: normal                 Vagina: normal appearing vagina with normal color and discharge, no lesions              Cervix: no lesions              Pap taken: Yes.   Bimanual Exam:  Uterus:  normal size, contour, position, consistency, mobility, non-tender              Adnexa: no mass, fullness, tenderness              Rectal exam: Yes.  .  Confirms.              Anus:  normal sphincter tone, no lesions  Chaperone was present for exam.  Assessment:   Well woman visit with normal exam. Mixed incontinence.  History of condyloma and LEEP. Osteopenia. Hx stress fracture.  Candida of flexural skin.  Plan: Mammogram screening discussed. Recommended self breast awareness. Pap and HR HPV as above. Guidelines for Calcium, Vitamin D, regular exercise program including cardiovascular and weight bearing exercise. Referral to Ileana Roup for PT. Refill of Nystatin powder.  Order for BMD at [facility]. Follow up annually and prn.     After visit summary provided.

## 2017-04-24 LAB — CYTOLOGY - PAP
DIAGNOSIS: NEGATIVE
HPV (WINDOPATH): NOT DETECTED

## 2017-05-19 ENCOUNTER — Other Ambulatory Visit: Payer: 59

## 2017-05-22 ENCOUNTER — Ambulatory Visit (INDEPENDENT_AMBULATORY_CARE_PROVIDER_SITE_OTHER): Payer: Self-pay

## 2017-05-22 DIAGNOSIS — B351 Tinea unguium: Secondary | ICD-10-CM

## 2017-05-27 DIAGNOSIS — E785 Hyperlipidemia, unspecified: Secondary | ICD-10-CM

## 2017-05-27 DIAGNOSIS — E1169 Type 2 diabetes mellitus with other specified complication: Secondary | ICD-10-CM | POA: Insufficient documentation

## 2017-05-27 DIAGNOSIS — R350 Frequency of micturition: Secondary | ICD-10-CM | POA: Insufficient documentation

## 2017-05-27 DIAGNOSIS — N3000 Acute cystitis without hematuria: Secondary | ICD-10-CM | POA: Insufficient documentation

## 2017-05-27 NOTE — Progress Notes (Signed)
Pt presents with mycotic infection of nails 1-5 bilateral   All other systems are negative  Laser therapy administered to affected nails and tolerated well. All safety precautions were in place. Re-appointed in 4 weeks for 5th treatment 

## 2017-06-19 ENCOUNTER — Inpatient Hospital Stay: Admission: RE | Admit: 2017-06-19 | Payer: 59 | Source: Ambulatory Visit

## 2017-06-23 ENCOUNTER — Ambulatory Visit: Payer: Self-pay | Admitting: *Deleted

## 2017-06-23 DIAGNOSIS — B351 Tinea unguium: Secondary | ICD-10-CM

## 2017-06-23 NOTE — Progress Notes (Signed)
Pt presents with mycotic infection of nails 1-5 bilateral. Patient reports improvements in nails. She would like to use a topical medication as well as the laser therapy.  All other systems are negative  Laser therapy administered to affected nails and tolerated well. All safety precautions were in place. Advised she could purchase Formula 3 and apply it topically every day, removing and filing build up once a week. Re-appointed in 4 weeks for Dr. Cannon Kettle to re-evaluate nails. Patient has completed 6 laser treatments.

## 2017-07-18 ENCOUNTER — Other Ambulatory Visit: Payer: 59

## 2017-07-21 ENCOUNTER — Encounter: Payer: Self-pay | Admitting: Podiatry

## 2017-07-21 ENCOUNTER — Ambulatory Visit: Payer: 59 | Admitting: Podiatry

## 2017-07-21 ENCOUNTER — Encounter: Payer: Self-pay | Admitting: *Deleted

## 2017-07-21 DIAGNOSIS — B351 Tinea unguium: Secondary | ICD-10-CM | POA: Diagnosis not present

## 2017-07-21 MED ORDER — TERBINAFINE HCL 250 MG PO TABS: ORAL_TABLET | ORAL | 0 refills | Status: DC

## 2017-07-23 NOTE — Progress Notes (Signed)
Subjective:   Patient ID: Donna Krause, female   DOB: 57 y.o.   MRN: [id number]   HPI Patient states she has had some improvement with the laser but wants her nails checked   ROS      Objective:  Physical Exam  Neurovascular status intact with patient's nails showing improvement with mild discoloration     Assessment:  Improving with laser but still having moderate pathology     Plan:  Recommended pulse Lamisil treatment and explained the procedure and risk and patient wants this and will be seen back as needed

## 2017-08-27 ENCOUNTER — Telehealth: Payer: Self-pay | Admitting: *Deleted

## 2017-08-27 NOTE — Telephone Encounter (Signed)
Refill request for terbinafine pulse. Dr. Paulla Dolly states pt received therapeutic dose and should wait 6-9 months to see healthy out-growth or should call for an appt. Return fax denying.

## 2017-08-28 ENCOUNTER — Encounter: Payer: Self-pay | Admitting: Obstetrics and Gynecology

## 2017-08-28 ENCOUNTER — Ambulatory Visit
Admission: RE | Admit: 2017-08-28 | Discharge: 2017-08-28 | Disposition: A | Discharge status: Home / Self Care | Payer: 59 | Source: Ambulatory Visit | Attending: Obstetrics and Gynecology | Admitting: Obstetrics and Gynecology

## 2017-08-28 DIAGNOSIS — Z87312 Personal history of (healed) stress fracture: Secondary | ICD-10-CM

## 2017-08-28 DIAGNOSIS — M8580 Other specified disorders of bone density and structure, unspecified site: Secondary | ICD-10-CM

## 2017-11-21 ENCOUNTER — Telehealth: Payer: Self-pay | Admitting: *Deleted

## 2017-11-21 NOTE — Telephone Encounter (Signed)
Request for refill terbinafine pulse pack. Dr. Paulla Dolly states pt has received therapeutic dose, will take 6-9 months for healthy outgrowth. Return fax denying.

## 2017-12-13 ENCOUNTER — Telehealth: Payer: Self-pay | Admitting: Sports Medicine

## 2017-12-19 ENCOUNTER — Ambulatory Visit: Payer: Self-pay

## 2017-12-19 DIAGNOSIS — M79676 Pain in unspecified toe(s): Secondary | ICD-10-CM

## 2017-12-19 DIAGNOSIS — M722 Plantar fascial fibromatosis: Secondary | ICD-10-CM

## 2017-12-23 ENCOUNTER — Ambulatory Visit: Payer: Self-pay

## 2017-12-23 DIAGNOSIS — M722 Plantar fascial fibromatosis: Secondary | ICD-10-CM

## 2017-12-23 DIAGNOSIS — M79676 Pain in unspecified toe(s): Secondary | ICD-10-CM

## 2017-12-26 NOTE — Progress Notes (Signed)
Patient is here today with complaint of bilateral heel and arch pain, this is been ongoing for several months.  She has tried various treatments with no relief.  She states that she has noticed some relief in her pain she is able to stand at work for longer periods of time without pain.  Mild pain on palpation to bilateral arches.  ESWT administered to both heels for 10 J and tolerated well.  Advised patient against use of NSAIDs and ice.  She is to wear supportive shoes, and inserts.  She is to follow-up next week for 3rd treatment.

## 2017-12-26 NOTE — Progress Notes (Signed)
Patient is here today with complaint of bilateral heel and arch pain, this is been ongoing for several months.  She has tried various treatments with no relief.  Mild pain on palpation to bilateral arches.  ESWT administered to both heels for 8 J and tolerated well.  Advised patient against use of NSAIDs and ice.  She is to wear supportive shoes, and inserts.  She is to follow-up next week for second treatment.

## 2018-01-02 ENCOUNTER — Other Ambulatory Visit: Payer: 59

## 2018-01-16 ENCOUNTER — Encounter

## 2018-01-16 ENCOUNTER — Ambulatory Visit: Payer: Self-pay

## 2018-01-16 DIAGNOSIS — M722 Plantar fascial fibromatosis: Secondary | ICD-10-CM

## 2018-01-16 DIAGNOSIS — M79676 Pain in unspecified toe(s): Secondary | ICD-10-CM

## 2018-01-20 NOTE — Progress Notes (Signed)
Patient is here today with complaint of bilateral heel and arch pain, this is been ongoing for several months.  She has tried various treatments with no relief.  She states that she has noticed some relief in her pain she is able to stand at work for longer periods of time without pain.  She recently went on a trip, and did a lot of walking.  She states that overall her pain on the trip was very minimal.  Mild pain on palpation to bilateral arches.  ESWT administered to both heels for 11 J and tolerated well.  Advised patient against use of NSAIDs and ice.  She is to wear supportive shoes, and inserts.  She is to follow-up next week for 3rd treatment.

## 2018-01-30 ENCOUNTER — Other Ambulatory Visit: Payer: 59

## 2018-03-11 DIAGNOSIS — Z124 Encounter for screening for malignant neoplasm of cervix: Secondary | ICD-10-CM | POA: Insufficient documentation

## 2018-05-06 ENCOUNTER — Ambulatory Visit: Payer: 59 | Admitting: Obstetrics and Gynecology

## 2018-05-08 ENCOUNTER — Ambulatory Visit: Payer: 59 | Admitting: Obstetrics and Gynecology

## 2018-05-21 NOTE — Progress Notes (Signed)
58 y.o. G67P1011 Married Caucasian female here for annual exam.    Has some muscle weakness.   Has flexural candida.   A1C 6.8  On insulin pump.  Wants help with weight loss.   PCP: Teressa Lower, MD   Endocrinology:  Dr. Meredith Pel, MD - [facility].   Patient's last menstrual period was 03/19/2007 (approximate).           Sexually active: No.  The current method of family planning is post menopausal status.    Exercising: Yes.    walking Smoker:  no  Health Maintenance: Pap: 04-21-17 Neg:Neg HR HPV,  03/07/15 Neg:Neg HR HPV History of abnormal Pap:  Yes, 1991 hx LEEP procedure MMG: 03-04-17 fatty tissue/Neg--[facility] Colonoscopy: 2012  normal in [place] per patient.   Did Cologuard 2017 and this was normal. BMD: 08-28-17 Result :Osteopenia TDaP:  PCP Gardasil:   no HIV: 08-07-06 NR Hep C: 08-07-06 Neg Screening Labs:  Hb today: PCP/Endocrinologist    reports that she has never smoked. She has never used smokeless tobacco. She reports that she does not drink alcohol or use drugs.  Past Medical History:  Diagnosis Date  . Abnormal Pap smear of cervix 1991   hx of LEEP procedure to cervix  . Diabetes mellitus without complication ([place])   . Hyperlipidemia   . Hypertension   . Neuropathy    in feet  . Osteopenia   . Sleep apnea    wears cpap  . STD (sexually transmitted disease)    hx. HSV and condyloma/HPV  . Urinary incontinence     Past Surgical History:  Procedure Laterality Date  . BREAST REDUCTION SURGERY  1992  . BREAST SURGERY     breast reduction  . CERVICAL BIOPSY  W/ LOOP ELECTRODE EXCISION  1991  . CERVICAL FUSION    . CHOLECYSTECTOMY  2002  . FOOT SURGERY  09/2012   right 5th metatarsal    Current Outpatient Medications  Medication Sig Dispense Refill  . aspirin 81 MG tablet Take 81 mg by mouth daily.    . calcium citrate-vitamin D (CITRACAL+D) 315-200 MG-UNIT tablet Take 1 tablet by mouth 2 (two) times daily.    . Cholecalciferol (VITAMIN  D3) 3000 units TABS Take by mouth.    . famotidine (PEPCID) 20 MG tablet Take 20 mg by mouth 2 (two) times daily.    Marland Kitchen glucosamine-chondroitin 500-400 MG tablet Take 1 tablet by mouth 3 (three) times daily.    . Insulin Disposable Pump (OMNIPOD DASH 5 PACK) MISC     . lisinopril (PRINIVIL,ZESTRIL) 10 MG tablet Take 10 mg by mouth daily.    . metFORMIN (GLUCOPHAGE) 500 MG tablet Take 2 tablets by mouth 2 (two) times daily.    . Multiple Vitamin (MULTIVITAMIN) tablet Take 1 tablet by mouth daily.    Marland Kitchen NOVOFINE 32G X 6 MM MISC     . NOVOLOG FLEXPEN 100 UNIT/ML FlexPen as needed.    . nystatin (MYCOSTATIN/NYSTOP) powder Apply topically 3 (three) times daily. Apply to affected area for up to 7 days 30 g 3  . ONE TOUCH ULTRA TEST test strip     . rosuvastatin (CRESTOR) 10 MG tablet Take 1 tablet by mouth daily.    Marland Kitchen terbinafine (LAMISIL) 250 MG tablet Please take one a day x 7days, repeat every 4 weeks x 4 months 28 tablet 0  . VICTOZA 18 MG/3ML SOPN Inject 1.8 mg as directed at bedtime.    . vitamin C (ASCORBIC ACID)  500 MG tablet Take 500 mg by mouth daily.     No current facility-administered medications for this visit.     Family History  Problem Relation Age of Onset  . Thyroid disease Mother   . Diabetes Father        Type 2   . Heart attack Father 74  . Heart failure Father   . Rheumatic fever Brother     Review of Systems  Musculoskeletal:       Muscle weakness Joint aches  All other systems reviewed and are negative.   Exam:   BP 120/78 (BP Location: Right Arm, Patient Position: Sitting, Cuff Size: Large)   Pulse 84   Resp 16   Ht 5\' 6"  (1.676 m)   Wt 252 lb 3.2 oz (114.4 kg)   LMP 03/19/2007 (Approximate)   BMI 40.71 kg/m     General appearance: alert, cooperative and appears stated age Head: Normocephalic, without obvious abnormality, atraumatic Neck: no adenopathy, supple, symmetrical, trachea midline and thyroid normal to inspection and palpation Lungs: clear to  auscultation bilaterally Breasts: normal appearance, no masses or tenderness, No nipple retraction or dimpling, No nipple discharge or bleeding, No axillary or supraclavicular adenopathy Heart: regular rate and rhythm Abdomen: obese, soft, non-tender; no masses, no organomegaly Extremities: extremities normal, atraumatic, no cyanosis or edema Skin: Skin color, texture, turgor normal. No rashes or lesions Lymph nodes: Cervical, supraclavicular, and axillary nodes normal. No abnormal inguinal nodes palpated Neurologic: Grossly normal  Pelvic: External genitalia:  no lesions              Urethra:  normal appearing urethra with no masses, tenderness or lesions              Bartholins and Skenes: normal                 Vagina: normal appearing vagina with normal color and discharge, no lesions              Cervix: no lesions              Pap taken: No. Bimanual Exam:  Uterus:  normal size, contour, position, consistency, mobility, non-tender              Adnexa: no mass, fullness, tenderness              Rectal exam: Yes.  .  Confirms.              Anus:  normal sphincter tone, no lesions  Chaperone was present for exam.  Assessment:   Well woman visit with normal exam. Mixed incontinence.  History of condyloma and LEEP. Osteopenia. Hx stress fracture.  Candida of flexural skin. Muscle weakness.  BMI 40.71.  Plan: Mammogram screening.  She will schedule at [facility].  Rx given. Recommended self breast awareness. Pap and HR HPV as above. Guidelines for Calcium, Vitamin D, regular exercise program including cardiovascular and weight bearing exercise. Rx for Nystatin powder. Referral to rheumatology.  Information for Dr. Dennard Nip for weight loss.  BMD in 2021 or 2022.  Follow up annually and prn.   After visit summary provided.

## 2018-05-22 ENCOUNTER — Encounter: Payer: Self-pay | Admitting: Obstetrics and Gynecology

## 2018-05-22 ENCOUNTER — Ambulatory Visit: Payer: 59 | Admitting: Obstetrics and Gynecology

## 2018-05-22 ENCOUNTER — Other Ambulatory Visit: Payer: Self-pay

## 2018-05-22 VITALS — BP 120/78 | HR 84 | Resp 16 | Ht 66.0 in | Wt 252.2 lb

## 2018-05-22 DIAGNOSIS — Z01419 Encounter for gynecological examination (general) (routine) without abnormal findings: Secondary | ICD-10-CM

## 2018-05-22 DIAGNOSIS — M6281 Muscle weakness (generalized): Secondary | ICD-10-CM | POA: Diagnosis not present

## 2018-05-22 MED ORDER — NYSTATIN 100000 UNIT/GM EX POWD: Freq: Three times a day (TID) | CUTANEOUS | 2 refills | Status: DC

## 2018-05-22 NOTE — Patient Instructions (Signed)

## 2018-05-24 ENCOUNTER — Encounter: Payer: Self-pay | Admitting: Obstetrics and Gynecology

## 2018-06-24 ENCOUNTER — Telehealth: Payer: Self-pay | Admitting: *Deleted

## 2018-06-24 NOTE — Telephone Encounter (Signed)
Pt called states she had 4 months of terbinafine and laser treatment for fungus and feels the fungus is coming back and would like a refill of the terbinafine.

## 2018-06-24 NOTE — Telephone Encounter (Signed)
Mel Please contact patient to let her know that I can refill her Lamisil for 1 more 30 day supply but 1st must have recent liver function test; We can order LFTs for patient and if normal we will refill her lamisil. Patient can come by office to pick up [facility] or we can mail it to her or if she has had recent blood work within the last month we can use that. Thanks Dr. Chauncey Cruel

## 2019-04-22 ENCOUNTER — Telehealth: Payer: Self-pay | Admitting: Obstetrics and Gynecology

## 2019-04-22 NOTE — Telephone Encounter (Signed)
Left message on voicemail to call and reschedule cancelled appointment. °

## 2019-05-24 ENCOUNTER — Encounter: Payer: Self-pay | Admitting: Obstetrics and Gynecology

## 2019-06-04 ENCOUNTER — Ambulatory Visit: Payer: 59 | Admitting: Obstetrics and Gynecology

## 2019-06-15 NOTE — Progress Notes (Signed)
59 y.o. G44P1011 Married Caucasian female here for annual exam.    Irritation in groin area. Patient states Nystatin is not helping.  A1C is 7.0. Using insulin pump.   No vaginal bleeding.   PCP:  Teressa Lower, MD  Endocrinology:  Alanson Aly, MD  Patient's last menstrual period was 03/19/2007 (approximate).           Sexually active: Yes.    The current method of family planning is post menopausal status.    Exercising: Yes.    treadmill and some weights Smoker:  no  Health Maintenance: Pap: 04-21-17 Neg:Neg HR HPV, 03/07/15 Neg:Neg HR HPV, 01-24-14 Neg:Neg HR HPV History of abnormal Pap:  Yes, Hx of LEEP in 1991 MMG:  06-04-19 abnormal per [facility] - BI-RADS 0,  Colonoscopy:  2016 Neg cologuard, 2010 colonoscopy normal BMD: 08-28-17  Result :Osteopenia TDaP: Unsure Gardasil:   no HIV:08-07-06 Hep C:08-07-06 Neg Screening Labs:  Endocrinology.    reports that she has never smoked. She has never used smokeless tobacco. She reports that she does not drink alcohol or use drugs.  Past Medical History:  Diagnosis Date  . Abnormal Pap smear of cervix 1991   hx of LEEP procedure to cervix  . Diabetes mellitus without complication ([place])   . Hyperlipidemia   . Hypertension   . Neuropathy    in feet  . Osteopenia   . Sleep apnea    wears cpap  . STD (sexually transmitted disease)    hx. HSV and condyloma/HPV  . Urinary incontinence     Past Surgical History:  Procedure Laterality Date  . BREAST REDUCTION SURGERY  1992  . BREAST SURGERY     breast reduction  . CERVICAL BIOPSY  W/ LOOP ELECTRODE EXCISION  1991  . CERVICAL FUSION    . CHOLECYSTECTOMY  2002  . FOOT SURGERY  09/2012   right 5th metatarsal    Current Outpatient Medications  Medication Sig Dispense Refill  . Accu-Chek FastClix Lancets MISC Use 6 times per day. E11.65    . Accu-Chek FastClix Lancets MISC     . aspirin 81 MG tablet Take 81 mg by mouth daily.    . calcium citrate-vitamin D  (CITRACAL+D) 315-200 MG-UNIT tablet Take 1 tablet by mouth 2 (two) times daily.    . Cholecalciferol (VITAMIN D3) 3000 units TABS Take by mouth.    . ciclopirox (PENLAC) 8 % solution USE AS DIRECTED   APPLY PER LABEL DIRECTIONS    . famotidine (PEPCID) 20 MG tablet Take 20 mg by mouth 2 (two) times daily.    Marland Kitchen glucosamine-chondroitin 500-400 MG tablet Take 1 tablet by mouth 3 (three) times daily.    . Insulin Disposable Pump (OMNIPOD DASH 5 PACK) MISC     . lisinopril (PRINIVIL,ZESTRIL) 10 MG tablet Take 10 mg by mouth daily.    . metFORMIN (GLUCOPHAGE-XR) 500 MG 24 hr tablet Take 1,000 mg by mouth 2 (two) times daily.    . Multiple Vitamin (MULTIVITAMIN) tablet Take 1 tablet by mouth daily.    Marland Kitchen NOVOFINE 32G X 6 MM MISC     . NOVOLOG FLEXPEN 100 UNIT/ML FlexPen as needed.    . nystatin (MYCOSTATIN/NYSTOP) powder Apply topically 3 (three) times daily. Apply to affected area for up to 7 days 100 g 2  . ONE TOUCH ULTRA TEST test strip     . OZEMPIC, 1 MG/DOSE, 2 MG/1.5ML SOPN Inject 1 mg into the skin once a week.    Marland Kitchen  rosuvastatin (CRESTOR) 10 MG tablet Take 1 tablet by mouth daily.    Marland Kitchen terbinafine (LAMISIL) 250 MG tablet Please take one a day x 7days, repeat every 4 weeks x 4 months 28 tablet 0  . VICTOZA 18 MG/3ML SOPN Inject 1.8 mg as directed at bedtime.    . vitamin C (ASCORBIC ACID) 500 MG tablet Take 500 mg by mouth daily.     No current facility-administered medications for this visit.    Family History  Problem Relation Age of Onset  . Thyroid disease Mother   . Diabetes Father        Type 2   . Heart attack Father 23  . Heart failure Father   . Rheumatic fever Brother     Review of Systems  All other systems reviewed and are negative.   Exam:   BP 130/74 (Cuff Size: Large)   Pulse 88   Temp 97.9 F (36.6 C) (Temporal)   Ht 5\' 7"  (1.702 m)   Wt 249 lb 12.8 oz (113.3 kg)   LMP 03/19/2007 (Approximate)   HC 14" (35.6 cm)   BMI 39.12 kg/m     General appearance:  alert, cooperative and appears stated age Head: normocephalic, without obvious abnormality, atraumatic Neck: no adenopathy, supple, symmetrical, trachea midline and thyroid normal to inspection and palpation Lungs: clear to auscultation bilaterally Breasts: consistent with reduction, no masses or tenderness, No nipple retraction or dimpling, No nipple discharge or bleeding, No axillary adenopathy Heart: regular rate and rhythm Abdomen: soft, non-tender; no masses, no organomegaly Extremities: extremities normal, atraumatic, no cyanosis or edema Skin: skin color, texture, turgor normal. No rashes or lesions Lymph nodes: cervical, supraclavicular, and axillary nodes normal. Neurologic: grossly normal  Pelvic: External genitalia:  Chaffing of skin in inguinal area noted.              No abnormal inguinal nodes palpated.              Urethra:  normal appearing urethra with no masses, tenderness or lesions              Bartholins and Skenes: normal                 Vagina: normal appearing vagina with normal color and discharge, no lesions              Cervix: no lesions              Pap taken: No. Bimanual Exam:  Uterus:  normal size, contour, position, consistency, mobility, non-tender              Adnexa: no mass, fullness, tenderness              Rectal exam: Yes.  .  Confirms.              Anus:  normal sphincter tone, no lesions  Chaperone was present for exam.  Assessment:   Well woman visit with normal exam. Hx bilateral breast reduction.  Current mammogram - BI-RADS 0.    Mixed incontinence. History of condyloma and LEEP. Osteopenia. Hx stress fracture. Hx Candida of flexural skin. Hx muscle weakness.  DM.  Colon cancer screening.   Plan: Mammogram dx of left breast at the [place] breast awareness reviewed. Pap and HR HPV as above. Guidelines for Calcium, Vitamin D, regular exercise program including cardiovascular and weight bearing exercise. BMD at [facility].  Rx for Diflucan 150 mg po x 1.  May repeat in 72 hours prn.  Disp:  2, RF none.  Referral to GI.  Follow up annually and prn.   After visit summary provided.

## 2019-06-16 ENCOUNTER — Other Ambulatory Visit: Payer: Self-pay

## 2019-06-17 ENCOUNTER — Encounter: Payer: Self-pay | Admitting: Obstetrics and Gynecology

## 2019-06-17 ENCOUNTER — Ambulatory Visit: Payer: 59 | Admitting: Obstetrics and Gynecology

## 2019-06-17 VITALS — BP 130/74 | HR 88 | Temp 97.9°F | Ht 67.0 in | Wt 249.8 lb

## 2019-06-17 DIAGNOSIS — Z01419 Encounter for gynecological examination (general) (routine) without abnormal findings: Secondary | ICD-10-CM | POA: Diagnosis not present

## 2019-06-17 DIAGNOSIS — Z1211 Encounter for screening for malignant neoplasm of colon: Secondary | ICD-10-CM | POA: Diagnosis not present

## 2019-06-17 DIAGNOSIS — Z780 Asymptomatic menopausal state: Secondary | ICD-10-CM

## 2019-06-17 DIAGNOSIS — R928 Other abnormal and inconclusive findings on diagnostic imaging of breast: Secondary | ICD-10-CM

## 2019-06-17 MED ORDER — FLUCONAZOLE 150 MG PO TABS: 150.0000 mg | ORAL_TABLET | Freq: Once | ORAL | 0 refills | Status: AC

## 2019-06-17 NOTE — Patient Instructions (Signed)

## 2019-06-17 NOTE — Progress Notes (Signed)
Spoke with patient while in office. Order placed for Bilateral Dx MMG and left breast US, if needed, at [facility]. Recall from screening MMG dated 06/04/19 at [facility]. Spoke with Lilia Pro at [facility], was advised they will have to request images, once received they will contact the patient directly to schedule. Patient verbalizes understanding and is agreeable.   Patient placed in MMG hold.

## 2019-06-18 ENCOUNTER — Other Ambulatory Visit: Payer: Self-pay | Admitting: Obstetrics and Gynecology

## 2019-06-18 DIAGNOSIS — R928 Other abnormal and inconclusive findings on diagnostic imaging of breast: Secondary | ICD-10-CM

## 2019-07-02 ENCOUNTER — Other Ambulatory Visit: Payer: Self-pay

## 2019-07-02 ENCOUNTER — Other Ambulatory Visit: Payer: Self-pay | Admitting: Obstetrics and Gynecology

## 2019-07-02 ENCOUNTER — Ambulatory Visit: Payer: 59

## 2019-07-02 ENCOUNTER — Ambulatory Visit
Admission: RE | Admit: 2019-07-02 | Discharge: 2019-07-02 | Disposition: A | Discharge status: Home / Self Care | Payer: 59 | Source: Ambulatory Visit | Attending: Obstetrics and Gynecology | Admitting: Obstetrics and Gynecology

## 2019-07-02 DIAGNOSIS — R921 Mammographic calcification found on diagnostic imaging of breast: Secondary | ICD-10-CM

## 2019-07-02 DIAGNOSIS — R928 Other abnormal and inconclusive findings on diagnostic imaging of breast: Secondary | ICD-10-CM

## 2019-07-06 ENCOUNTER — Other Ambulatory Visit: Payer: Self-pay

## 2019-07-06 ENCOUNTER — Ambulatory Visit
Admission: RE | Admit: 2019-07-06 | Discharge: 2019-07-06 | Disposition: A | Discharge status: Home / Self Care | Payer: 59 | Source: Ambulatory Visit | Attending: Obstetrics and Gynecology | Admitting: Obstetrics and Gynecology

## 2019-07-06 DIAGNOSIS — R921 Mammographic calcification found on diagnostic imaging of breast: Secondary | ICD-10-CM

## 2019-07-06 DIAGNOSIS — R29898 Other symptoms and signs involving the musculoskeletal system: Secondary | ICD-10-CM | POA: Insufficient documentation

## 2019-07-07 ENCOUNTER — Other Ambulatory Visit: Payer: Self-pay | Admitting: Obstetrics and Gynecology

## 2019-07-07 DIAGNOSIS — R921 Mammographic calcification found on diagnostic imaging of breast: Secondary | ICD-10-CM

## 2019-07-15 ENCOUNTER — Inpatient Hospital Stay: Admission: RE | Admit: 2019-07-15 | Payer: 59 | Source: Ambulatory Visit

## 2019-09-13 ENCOUNTER — Ambulatory Visit
Admission: RE | Admit: 2019-09-13 | Discharge: 2019-09-13 | Disposition: A | Discharge status: Home / Self Care | Payer: 59 | Source: Ambulatory Visit | Attending: Obstetrics and Gynecology | Admitting: Obstetrics and Gynecology

## 2019-09-13 ENCOUNTER — Other Ambulatory Visit: Payer: Self-pay

## 2019-09-13 DIAGNOSIS — Z780 Asymptomatic menopausal state: Secondary | ICD-10-CM

## 2019-10-02 ENCOUNTER — Other Ambulatory Visit: Payer: Self-pay | Admitting: Obstetrics and Gynecology

## 2019-10-04 NOTE — Telephone Encounter (Signed)
Medication refill request: nystatin powder Last AEX:  06/17/19 Next AEX: 06/20/20  Last MMG (if hormonal medication request): NA Refill authorized: #[id number] with 2 rf please refill if appropriate.

## 2020-01-03 ENCOUNTER — Other Ambulatory Visit: Payer: Self-pay | Admitting: Obstetrics and Gynecology

## 2020-01-03 ENCOUNTER — Ambulatory Visit
Admission: RE | Admit: 2020-01-03 | Discharge: 2020-01-03 | Disposition: A | Discharge status: Home / Self Care | Payer: Managed Care, Other (non HMO) | Source: Ambulatory Visit | Attending: Obstetrics and Gynecology | Admitting: Obstetrics and Gynecology

## 2020-01-03 ENCOUNTER — Other Ambulatory Visit: Payer: Self-pay

## 2020-01-03 DIAGNOSIS — R921 Mammographic calcification found on diagnostic imaging of breast: Secondary | ICD-10-CM

## 2020-03-07 ENCOUNTER — Other Ambulatory Visit: Payer: Self-pay | Admitting: Obstetrics and Gynecology

## 2020-03-08 NOTE — Telephone Encounter (Signed)
Medication refill request: Nystatin Last AEX:  06/17/19 Dr. Quincy Simmonds Next AEX: 06/20/20 Last MMG (if hormonal medication request): n/a Refill authorized: today, please advise

## 2020-03-10 ENCOUNTER — Other Ambulatory Visit: Payer: Self-pay | Admitting: Obstetrics and Gynecology

## 2020-03-13 NOTE — Telephone Encounter (Signed)
Refill request received for diflucan.   Call placed to patient. Patient reports redness, itching and burning in groin and upper thigh. Skin is darkened, not broken. Symptoms have been ongoing for 2-3 months, not resolving with nystatin powder. Denies any other GYN symptoms.   Advised OV recommended for further evaluation, patient agreeable. OV scheduled for 12/28 at 11:30am with Dr. Edward Jolly.   Last AEX 06/17/19  Routing to provider for final review. Patient is agreeable to disposition. Will close encounter.

## 2020-03-14 ENCOUNTER — Encounter: Payer: Self-pay | Admitting: Obstetrics and Gynecology

## 2020-03-14 ENCOUNTER — Ambulatory Visit: Payer: 59 | Admitting: Obstetrics and Gynecology

## 2020-03-14 ENCOUNTER — Other Ambulatory Visit: Payer: Self-pay

## 2020-03-14 VITALS — BP 120/72 | HR 91 | Ht 67.0 in | Wt 243.0 lb

## 2020-03-14 DIAGNOSIS — B372 Candidiasis of skin and nail: Secondary | ICD-10-CM

## 2020-03-14 MED ORDER — FLUCONAZOLE 150 MG PO TABS: ORAL_TABLET | ORAL | 1 refills | Status: DC

## 2020-03-14 NOTE — Progress Notes (Signed)
GYNECOLOGY  VISIT   HPI: 59 y.o.   Married  Caucasian  female   G2P1011 with Patient's last menstrual period was 03/19/2007 (approximate).   here for irritation in left groin area.   Uses Lamisil and Nystatin powder, which help, but then symptoms recur. She used Diflucan in the past.   Skin of the left groin was recently quite red and then became dark in color.  No skin breakdown.  No lesions elsewhere on skin.   Doing laser therapy for toenail fungus.   Notes her symptoms are worse the weeks when she is working a full week straight.   A1C is 7.   GYNECOLOGIC HISTORY: Patient's last menstrual period was 03/19/2007 (approximate). Contraception:  PMP Menopausal hormone therapy:  none Last mammogram: 01-03-20 Diag.Lt./no new suspicious findings/unchanged left br.calcifications/density B/BiRads/bil.screening 46mo. Last pap smear:  04-21-17 Neg:Neg HR HPV,03/07/15 Neg:Neg HR HPV, 01-24-14 Neg:Neg HR HPV.  Hx of LEEP in 1991        OB History    Gravida  2   Para  1   Term  1   Preterm      AB  1   Living  1     SAB      IAB      Ectopic      Multiple      Live Births                 Patient Active Problem List   Diagnosis Date Noted  . Acute cystitis without hematuria 05/27/2017  . Hyperlipidemia associated with type 2 diabetes mellitus (HCC) 05/27/2017  . Increased frequency of urination 05/27/2017  . Screening for breast cancer 02/18/2017  . Closed nondisplaced fracture of fifth metatarsal bone of right foot with routine healing 01/10/2017  . Essential hypertension 01/09/2017  . Obstructive sleep apnea syndrome 04/27/2015  . Severe obesity (BMI 35.0-39.9) with comorbidity (HCC) 04/27/2015  . Type 2 diabetes mellitus with hyperglycemia, with long-term current use of insulin (HCC) 04/27/2015  . Vitamin D deficiency 04/27/2015    Past Medical History:  Diagnosis Date  . Abnormal Pap smear of cervix 1991   hx of LEEP procedure to cervix  . Diabetes  mellitus without complication (HCC)   . Hyperlipidemia   . Hypertension   . Neuropathy    in feet  . Osteopenia   . Sleep apnea    wears cpap  . STD (sexually transmitted disease)    hx. HSV and condyloma/HPV  . Urinary incontinence     Past Surgical History:  Procedure Laterality Date  . BREAST REDUCTION SURGERY  1992  . BREAST SURGERY     breast reduction  . CERVICAL BIOPSY  W/ LOOP ELECTRODE EXCISION  1991  . CERVICAL FUSION    . CHOLECYSTECTOMY  2002  . FOOT SURGERY  09/2012   right 5th metatarsal    Current Outpatient Medications  Medication Sig Dispense Refill  . Accu-Chek FastClix Lancets MISC Use 6 times per day. E11.65    . Accu-Chek FastClix Lancets MISC     . aspirin 81 MG tablet Take 81 mg by mouth daily.    . calcium citrate-vitamin D (CITRACAL+D) 315-200 MG-UNIT tablet Take 1 tablet by mouth 2 (two) times daily.    . Cholecalciferol (VITAMIN D3) 3000 units TABS Take by mouth.    . ciclopirox (PENLAC) 8 % solution USE AS DIRECTED   APPLY PER LABEL DIRECTIONS    . famotidine (PEPCID) 20 MG tablet Take 20 mg  by mouth 2 (two) times daily.    Marland Kitchen glucosamine-chondroitin 500-400 MG tablet Take 1 tablet by mouth 3 (three) times daily.    . Insulin Disposable Pump (OMNIPOD DASH 5 PACK) MISC     . lisinopril (ZESTRIL) 40 MG tablet     . metFORMIN (GLUCOPHAGE-XR) 500 MG 24 hr tablet Take 1,000 mg by mouth 2 (two) times daily.    . Multiple Vitamin (MULTIVITAMIN) tablet Take 1 tablet by mouth daily.    Marland Kitchen NEXLETOL 180 MG TABS Take 1 tablet by mouth daily.    Marland Kitchen NOVOFINE 32G X 6 MM MISC     . NOVOLOG FLEXPEN 100 UNIT/ML FlexPen as needed.    . nystatin (MYCOSTATIN/NYSTOP) powder APPLY TOPICALLY 3 (THREE) TIMES DAILY. APPLY TO AFFECTED AREA FOR UP TO 7 DAYS 120 g 2  . ONE TOUCH ULTRA TEST test strip     . OZEMPIC, 1 MG/DOSE, 4 MG/3ML SOPN     . terbinafine (LAMISIL) 250 MG tablet Please take one a day x 7days, repeat every 4 weeks x 4 months 28 tablet 0  . vitamin C  (ASCORBIC ACID) 500 MG tablet Take 500 mg by mouth daily.     No current facility-administered medications for this visit.     ALLERGIES: Dapagliflozin  Family History  Problem Relation Age of Onset  . Thyroid disease Mother   . Diabetes Father        Type 2   . Heart attack Father 59  . Heart failure Father   . Rheumatic fever Brother     Social History   Socioeconomic History  . Marital status: Married    Spouse name: Not on file  . Number of children: Not on file  . Years of education: Not on file  . Highest education level: Not on file  Occupational History  . Not on file  Tobacco Use  . Smoking status: Never Smoker  . Smokeless tobacco: Never Used  Vaping Use  . Vaping Use: Never used  Substance and Sexual Activity  . Alcohol use: No    Alcohol/week: 0.0 standard drinks  . Drug use: No  . Sexual activity: Not Currently    Partners: Male    Birth control/protection: Post-menopausal  Other Topics Concern  . Not on file  Social History Narrative  . Not on file   Social Determinants of Health   Financial Resource Strain: Not on file  Food Insecurity: Not on file  Transportation Needs: Not on file  Physical Activity: Not on file  Stress: Not on file  Social Connections: Not on file  Intimate Partner Violence: Not on file    Review of Systems  All other systems reviewed and are negative.   PHYSICAL EXAMINATION:    BP 120/72 (Cuff Size: Large)   Pulse 91   Ht 5\' 7"  (1.702 m)   Wt 243 lb (110.2 kg)   LMP 03/19/2007 (Approximate)   SpO2 97%   BMI 38.06 kg/m     General appearance: alert, cooperative and appears stated age   Skin: left inguinal region with generalized brown coloration of the skin and white powder present, which was cleansed away with a soap wipe.  Odor present.  Skin is intact.    No abnormal inguinal nodes palpated.   Pelvic: External genitalia:  no lesions           Chaperone was present for exam.  ASSESSMENT  Intertrigo.   I suspect Candida is the source.   PLAN  Will treat with Diflucan 150 mg po q 72 hours x 3 doses and then weekly for 4 - 6 months.  Keep area clean and dry.  Keep blood sugar in good control. Return for annual exam and sooner if symptoms are not improved.  If persists, may need to see dermatology.

## 2020-03-24 ENCOUNTER — Other Ambulatory Visit: Payer: 59

## 2020-04-21 ENCOUNTER — Other Ambulatory Visit: Payer: Self-pay

## 2020-04-21 ENCOUNTER — Ambulatory Visit (INDEPENDENT_AMBULATORY_CARE_PROVIDER_SITE_OTHER): Payer: 59 | Admitting: *Deleted

## 2020-04-21 DIAGNOSIS — L603 Nail dystrophy: Secondary | ICD-10-CM

## 2020-04-21 NOTE — Progress Notes (Signed)
Patient presents today for the 1st (2nd round) laser treatment. Diagnosed with mycotic nail infection by Dr. Cannon Kettle.   Toenail most affected are the hallux nails bilateral and 2nd and 5th left.  She has had laser treatment in the past as well as multiple topicals (Formula 3 and ciclopirox) and oral terbinafine. The nails have cleared, but continues to come back.  All other systems are negative.  Nails were filed thin. Laser therapy was administered to 1st bilateral and 2nd and 5th toenails left and patient tolerated the treatment well. All safety precautions were in place.     Follow up in 4 weeks for laser # 2.  ~No pic of nails taken today-take at next visit~

## 2020-04-25 DIAGNOSIS — D485 Neoplasm of uncertain behavior of skin: Secondary | ICD-10-CM | POA: Insufficient documentation

## 2020-05-19 ENCOUNTER — Other Ambulatory Visit: Payer: Self-pay

## 2020-05-19 ENCOUNTER — Ambulatory Visit (INDEPENDENT_AMBULATORY_CARE_PROVIDER_SITE_OTHER): Payer: 59

## 2020-05-19 DIAGNOSIS — L603 Nail dystrophy: Secondary | ICD-10-CM

## 2020-05-19 DIAGNOSIS — B351 Tinea unguium: Secondary | ICD-10-CM

## 2020-05-19 NOTE — Progress Notes (Signed)
Patient presents today for the 2nd (2nd round) laser treatment. Diagnosed with mycotic nail infection by Dr. Cannon Kettle.   Toenail most affected are the hallux nails bilateral and 2nd and 5th left.  She has had laser treatment in the past as well as multiple topicals (Formula 3 and ciclopirox) and oral terbinafine. The nails have cleared, but continues to come back.  All other systems are negative.  Nails were filed thin. Laser therapy was administered to 1st bilateral and 2nd and 5th toenails left and patient tolerated the treatment well. All safety precautions were in place.     Follow up in 4 weeks for laser # 3.  ~No pic of nails taken today-take at next visit~

## 2020-06-16 ENCOUNTER — Ambulatory Visit (INDEPENDENT_AMBULATORY_CARE_PROVIDER_SITE_OTHER): Payer: 59

## 2020-06-16 ENCOUNTER — Other Ambulatory Visit: Payer: Self-pay

## 2020-06-16 DIAGNOSIS — B351 Tinea unguium: Secondary | ICD-10-CM

## 2020-06-16 DIAGNOSIS — L603 Nail dystrophy: Secondary | ICD-10-CM

## 2020-06-16 NOTE — Progress Notes (Signed)
Patient presents today for the 3rd (2nd round) laser treatment. Diagnosed with mycotic nail infection by Dr. Cannon Kettle.   Toenail most affected are the hallux nails bilateral and 2nd and 5th left.  She has had laser treatment in the past as well as multiple topicals (Formula 3 and ciclopirox) and oral terbinafine. The nails have cleared, but continues to come back.  All other systems are negative.  Nails were filed thin. Laser therapy was administered to 1st bilateral and 2nd and 5th toenails left and patient tolerated the treatment well. All safety precautions were in place.     Follow up in 6 weeks for laser # 4.  ~No pic of nails taken today-take at next visit~

## 2020-06-20 ENCOUNTER — Other Ambulatory Visit (HOSPITAL_COMMUNITY)
Admission: RE | Admit: 2020-06-20 | Discharge: 2020-06-20 | Disposition: A | Discharge status: Home / Self Care | Payer: 59 | Source: Ambulatory Visit | Attending: Obstetrics and Gynecology | Admitting: Obstetrics and Gynecology

## 2020-06-20 ENCOUNTER — Other Ambulatory Visit: Payer: Self-pay

## 2020-06-20 ENCOUNTER — Encounter: Payer: Self-pay | Admitting: Obstetrics and Gynecology

## 2020-06-20 ENCOUNTER — Ambulatory Visit (INDEPENDENT_AMBULATORY_CARE_PROVIDER_SITE_OTHER): Payer: 59 | Admitting: Obstetrics and Gynecology

## 2020-06-20 VITALS — BP 120/72 | HR 95 | Ht 66.0 in | Wt 240.0 lb

## 2020-06-20 DIAGNOSIS — B356 Tinea cruris: Secondary | ICD-10-CM | POA: Diagnosis not present

## 2020-06-20 DIAGNOSIS — Z124 Encounter for screening for malignant neoplasm of cervix: Secondary | ICD-10-CM | POA: Diagnosis present

## 2020-06-20 DIAGNOSIS — Z1211 Encounter for screening for malignant neoplasm of colon: Secondary | ICD-10-CM | POA: Diagnosis not present

## 2020-06-20 DIAGNOSIS — Z01419 Encounter for gynecological examination (general) (routine) without abnormal findings: Secondary | ICD-10-CM | POA: Diagnosis present

## 2020-06-20 NOTE — Patient Instructions (Signed)

## 2020-06-20 NOTE — Progress Notes (Signed)
60 y.o. G39P1011 Married Caucasian female here for annual exam.    Patient has rash in right groin which started 4 days ago. She did use some anti-fungal cream and has been taking Diflucan weekly.  Using Clotrimazole once daily.   Requests cervical cancer screening today.   PCP:  Teressa Lower MD   Patient's last menstrual period was 03/19/2007 (approximate).           Sexually active: Yes.    The current method of family planning is post menopausal status.    Exercising: Yes.    Treadmill, some weights and walking Smoker:  no  Health Maintenance: Pap:  04-21-17 Neg:Neg HR HPV,03/07/15 Neg:Neg HR HPV, 01-24-14 Neg:Neg HR HPV History of abnormal Pap:  Yes, Hx of LEEP in 1991 MMG: 01-03-20 Diag.Lt/Unchanged residual LEFT breast calcifications, biopsy proven to be benign/Bil.screening 6 months/BiRads2.  She will schedule.  Colonoscopy: 2016 Neg cologuard, 2010 colonoscopy normal  BMD: 09-13-19  Result :Osteopenia of hip TDaP: unsure. Gardasil:   no HIV:08-07-06 NR Hep C:08-07-06 Neg Screening Labs:  PCP.    reports that she has never smoked. She has never used smokeless tobacco. She reports that she does not drink alcohol and does not use drugs.  Past Medical History:  Diagnosis Date  . Abnormal Pap smear of cervix 1991   hx of LEEP procedure to cervix  . Diabetes mellitus without complication ([place])   . Hyperlipidemia   . Hypertension   . Neuropathy    in feet  . Osteopenia   . Sleep apnea    wears cpap  . STD (sexually transmitted disease)    hx. HSV and condyloma/HPV  . Urinary incontinence     Past Surgical History:  Procedure Laterality Date  . BREAST REDUCTION SURGERY  1992  . BREAST SURGERY     breast reduction  . CERVICAL BIOPSY  W/ LOOP ELECTRODE EXCISION  1991  . CERVICAL FUSION    . CHOLECYSTECTOMY  2002  . FOOT SURGERY  09/2012   right 5th metatarsal    Current Outpatient Medications  Medication Sig Dispense Refill  . Accu-Chek FastClix Lancets MISC Use  6 times per day. E11.65    . aspirin 81 MG tablet Take 81 mg by mouth daily.    . calcium citrate-vitamin D (CITRACAL+D) 315-200 MG-UNIT tablet Take 1 tablet by mouth 2 (two) times daily.    . Cholecalciferol (VITAMIN D3) 3000 units TABS Take by mouth.    . famotidine (PEPCID) 20 MG tablet Take 20 mg by mouth 2 (two) times daily.    . fluconazole (DIFLUCAN) 150 MG tablet Take one tablet by mouth every 72 hours for 3 doses.  Then take one tablet by mouth weekly. 18 tablet 1  . glucosamine-chondroitin 500-400 MG tablet Take 1 tablet by mouth 3 (three) times daily.    . Insulin Disposable Pump (OMNIPOD DASH 5 PACK) MISC     . lisinopril (ZESTRIL) 40 MG tablet     . metFORMIN (GLUCOPHAGE) 1000 MG tablet Take 1 tablet by mouth 2 (two) times daily.    . Multiple Vitamin (MULTIVITAMIN) tablet Take 1 tablet by mouth daily.    Marland Kitchen NEXLETOL 180 MG TABS Take 1 tablet by mouth daily.    Marland Kitchen NOVOFINE 32G X 6 MM MISC     . NOVOLOG FLEXPEN 100 UNIT/ML FlexPen as needed.    . nystatin (MYCOSTATIN/NYSTOP) powder APPLY TOPICALLY 3 (THREE) TIMES DAILY. APPLY TO AFFECTED AREA FOR UP TO 7 DAYS 120 g 2  .  ONE TOUCH ULTRA TEST test strip     . OZEMPIC, 1 MG/DOSE, 4 MG/3ML SOPN     . vitamin C (ASCORBIC ACID) 500 MG tablet Take 500 mg by mouth daily.     No current facility-administered medications for this visit.    Family History  Problem Relation Age of Onset  . Thyroid disease Mother   . Diabetes Father        Type 2   . Heart attack Father 30  . Heart failure Father   . Rheumatic fever Brother     Review of Systems  All other systems reviewed and are negative.   Exam:   BP 120/72   Pulse 95   Ht 5\' 6"  (1.676 m)   Wt 240 lb (108.9 kg)   LMP 03/19/2007 (Approximate)   SpO2 97%   BMI 38.74 kg/m     General appearance: alert, cooperative and appears stated age Head: normocephalic, without obvious abnormality, atraumatic Neck: no adenopathy, supple, symmetrical, trachea midline and thyroid normal  to inspection and palpation Lungs: clear to auscultation bilaterally Breasts: normal appearance, no masses or tenderness, No nipple retraction or dimpling, No nipple discharge or bleeding, No axillary adenopathy Heart: regular rate and rhythm Abdomen: soft, non-tender; no masses, no organomegaly Extremities: extremities normal, atraumatic, no cyanosis or edema Skin: skin color, texture, turgor normal. No rashes or lesions Lymph nodes: cervical, supraclavicular, and axillary nodes normal. Neurologic: grossly normal  Pelvic: External genitalia:  Left inguinal region with patch of erythema.               No abnormal inguinal nodes palpated.              Urethra:  normal appearing urethra with no masses, tenderness or lesions              Bartholins and Skenes: normal                 Vagina: normal appearing vagina with normal color and discharge, no lesions              Cervix: no lesions              Pap taken: Yes.   Bimanual Exam:  Uterus:  normal size, contour, position, consistency, mobility, non-tender              Adnexa: no mass, fullness, tenderness              Rectal exam: Yes.  .  Confirms.              Anus:  normal sphincter tone, no lesions  Chaperone was present for exam.  Assessment:   Well woman visit with normal exam. Hx bilateral breast reduction.  Tinea cruris.  Hx mixed incontinence. History of condyloma and LEEP. Osteopenia. Hx stress fracture.  DM.  Colon cancer screening.   Plan: Mammogram screening discussed. Self breast awareness reviewed. Pap and HR HPV as above. Guidelines for Calcium, Vitamin D, regular exercise program including cardiovascular and weight bearing exercise. Referral for colonoscopy.  Increase clotrimazole to bid.  OK to continue weekly Diflucan.  Will refer to dermatology.  BMD in June 2022 or 2023.  Follow up annually and prn.

## 2020-06-21 LAB — CYTOLOGY - PAP
Comment: NEGATIVE
Diagnosis: NEGATIVE
High risk HPV: NEGATIVE

## 2020-06-22 ENCOUNTER — Other Ambulatory Visit: Payer: Self-pay | Admitting: Obstetrics and Gynecology

## 2020-06-22 DIAGNOSIS — Z1231 Encounter for screening mammogram for malignant neoplasm of breast: Secondary | ICD-10-CM

## 2020-07-28 ENCOUNTER — Ambulatory Visit (INDEPENDENT_AMBULATORY_CARE_PROVIDER_SITE_OTHER): Payer: Self-pay | Admitting: *Deleted

## 2020-07-28 ENCOUNTER — Other Ambulatory Visit: Payer: Self-pay

## 2020-07-28 DIAGNOSIS — B351 Tinea unguium: Secondary | ICD-10-CM

## 2020-07-28 DIAGNOSIS — L603 Nail dystrophy: Secondary | ICD-10-CM

## 2020-07-28 NOTE — Progress Notes (Signed)
Patient presents today for the 1st (2nd round) laser treatment. Diagnosed with mycotic nail infection by Dr. Cannon Kettle.   Toenail most affected are the hallux nails bilateral and 2nd and 5th left. They are looking a little better  She has had laser treatment in the past as well as multiple topicals (Formula 3 and ciclopirox) and oral terbinafine. The nails have cleared, but continues to come back.  All other systems are negative.  Nails were filed thin. Laser therapy was administered to 1st bilateral and 2nd and 5th toenails left and patient tolerated the treatment well. All safety precautions were in place.     Follow up in 6 weeks for laser # 5

## 2020-08-11 ENCOUNTER — Other Ambulatory Visit: Payer: Self-pay

## 2020-08-11 ENCOUNTER — Ambulatory Visit
Admission: RE | Admit: 2020-08-11 | Discharge: 2020-08-11 | Disposition: A | Discharge status: Home / Self Care | Payer: Managed Care, Other (non HMO) | Source: Ambulatory Visit

## 2020-08-11 DIAGNOSIS — Z1231 Encounter for screening mammogram for malignant neoplasm of breast: Secondary | ICD-10-CM

## 2020-09-22 ENCOUNTER — Other Ambulatory Visit: Payer: Self-pay

## 2020-09-22 ENCOUNTER — Ambulatory Visit (INDEPENDENT_AMBULATORY_CARE_PROVIDER_SITE_OTHER): Payer: Self-pay

## 2020-09-22 DIAGNOSIS — L603 Nail dystrophy: Secondary | ICD-10-CM

## 2020-09-22 DIAGNOSIS — B351 Tinea unguium: Secondary | ICD-10-CM

## 2020-09-22 NOTE — Progress Notes (Signed)
Patient presents today for the 5th (2nd round) laser treatment. Diagnosed with mycotic nail infection by Dr. Cannon Kettle.   Toenail most affected are the hallux nails bilateral and 2nd and 5th left. They are looking a little better  She has had laser treatment in the past as well as multiple topicals (Formula 3 and ciclopirox) and oral terbinafine. The nails have cleared, but continues to come back.  All other systems are negative.  Nails were filed thin. Laser therapy was administered to 1st bilateral and 2nd and 5th toenails left and patient tolerated the treatment well. All safety precautions were in place.     Follow up in 6 weeks for laser # 6

## 2020-11-17 ENCOUNTER — Other Ambulatory Visit: Payer: Self-pay

## 2020-11-17 ENCOUNTER — Ambulatory Visit (INDEPENDENT_AMBULATORY_CARE_PROVIDER_SITE_OTHER): Payer: Self-pay

## 2020-11-17 DIAGNOSIS — L603 Nail dystrophy: Secondary | ICD-10-CM

## 2020-11-17 DIAGNOSIS — B351 Tinea unguium: Secondary | ICD-10-CM

## 2020-11-17 NOTE — Progress Notes (Signed)
Patient presents today for the 6th (2nd round) laser treatment. Diagnosed with mycotic nail infection by Dr. Cannon Kettle.   Toenail most affected are the hallux nails bilateral and 2nd and 5th left. They are looking a little better  She has had laser treatment in the past as well as multiple topicals (Formula 3 and ciclopirox) and oral terbinafine. The nails have cleared, but continues to come back.  All other systems are negative.  Nails were filed thin. Laser therapy was administered to 1st bilateral and 2nd and 5th toenails left and patient tolerated the treatment well. All safety precautions were in place.    Patient has completed the recommended laser treatments. He will follow up with Dr. Cannon Kettle in 3 months to evaluate progress.

## 2020-11-17 NOTE — Patient Instructions (Signed)

## 2021-02-22 ENCOUNTER — Ambulatory Visit: Payer: 59 | Admitting: Sports Medicine

## 2021-02-23 ENCOUNTER — Ambulatory Visit: Payer: 59 | Admitting: Sports Medicine

## 2021-03-09 ENCOUNTER — Ambulatory Visit (INDEPENDENT_AMBULATORY_CARE_PROVIDER_SITE_OTHER): Payer: 59 | Admitting: Sports Medicine

## 2021-03-09 ENCOUNTER — Encounter: Payer: Self-pay | Admitting: Sports Medicine

## 2021-03-09 DIAGNOSIS — E119 Type 2 diabetes mellitus without complications: Secondary | ICD-10-CM | POA: Diagnosis not present

## 2021-03-09 DIAGNOSIS — L603 Nail dystrophy: Secondary | ICD-10-CM

## 2021-03-09 NOTE — Progress Notes (Signed)
Subjective: Donna Krause is a 60 y.o. female patient with history of diabetes who presents to office today for check up after laser. Reports that it has helped but not as good as she hoped still has some thickness on the big toes and left 5th toenail. Using Penlac solution.  No other pedal complaints noted.  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 04/25/2020   Weakness of both legs 07/06/2019   Screening for cervical cancer 03/11/2018   Acute cystitis without hematuria 05/27/2017   Hyperlipidemia associated with type 2 diabetes mellitus ([place]) 05/27/2017   Increased frequency of urination 05/27/2017   Screening for breast cancer 02/18/2017   Closed nondisplaced fracture of fifth metatarsal bone of right foot with routine healing 01/10/2017   Essential hypertension 01/09/2017   Obstructive sleep apnea syndrome 04/27/2015   Severe obesity (BMI 35.0-39.9) with comorbidity ([place]) 04/27/2015   Type 2 diabetes mellitus with hyperglycemia, with long-term current use of insulin ([place]) 04/27/2015   Vitamin D deficiency 04/27/2015   Current Outpatient Medications on File Prior to Visit  Medication Sig Dispense Refill   lisinopril (ZESTRIL) 40 MG tablet Take by mouth.     Naltrexone-buPROPion HCl (CONTRAVE PO)      Accu-Chek FastClix Lancets MISC Use 6 times per day. E11.65     aspirin 81 MG tablet Take 81 mg by mouth daily.     calcium citrate-vitamin D (CITRACAL+D) 315-200 MG-UNIT tablet Take 1 tablet by mouth 2 (two) times daily.     Cholecalciferol (VITAMIN D3) 3000 units TABS Take by mouth.     famotidine (PEPCID) 20 MG tablet Take 20 mg by mouth 2 (two) times daily.     fluconazole (DIFLUCAN) 150 MG tablet Take one tablet by mouth every 72 hours for 3 doses.  Then take one tablet by mouth weekly. 18 tablet 1   glucosamine-chondroitin 500-400 MG tablet Take 1 tablet by mouth 3 (three) times daily.      Insulin Disposable Pump (OMNIPOD DASH 5 PACK) MISC      lisinopril (ZESTRIL) 40 MG tablet      metFORMIN (GLUCOPHAGE) 1000 MG tablet Take 1 tablet by mouth 2 (two) times daily.     Multiple Vitamin (MULTIVITAMIN) tablet Take 1 tablet by mouth daily.     NEXLETOL 180 MG TABS Take 1 tablet by mouth daily.     NOVOFINE 32G X 6 MM MISC      NOVOLOG FLEXPEN 100 UNIT/ML FlexPen as needed.     nystatin (MYCOSTATIN/NYSTOP) powder APPLY TOPICALLY 3 (THREE) TIMES DAILY. APPLY TO AFFECTED AREA FOR UP TO 7 DAYS 120 g 2   ONE TOUCH ULTRA TEST test strip      OZEMPIC, 1 MG/DOSE, 4 MG/3ML SOPN      vitamin C (ASCORBIC ACID) 500 MG tablet Take 500 mg by mouth daily.     No current facility-administered medications on file prior to visit.   Allergies  Allergen Reactions   Dapagliflozin Nausea And Vomiting and Other (See Comments)    dizziness    No results found for this or any previous visit (from the past 2160 hour(s)).  Objective: General: Patient is awake, alert, and oriented x 3 and in no acute distress.  Integument: Skin is warm, dry and supple bilateral. Nails are tender, long, thickened and  dystrophic with subungual debris, consistent with onychomycosis, 1 and 5 bilateral most involved. No signs  of infection. No open lesions or preulcerative lesions present bilateral. Remaining integument unremarkable.  Neurovascular status intact.  Musculoskeletal: Asymptomatic bunion and hammertoe pedal deformities noted bilateral. Muscular strength 5/5 in all lower extremity muscular groups bilateral without pain on range of motion . No tenderness with calf compression bilateral.  Assessment and Plan: Problem List Items Addressed This Visit   None Visit Diagnoses     Nail dystrophy    -  Primary   Diabetes mellitus without complication ([place])       Relevant Medications   lisinopril (ZESTRIL) 40 MG tablet       -Examined patient. -Advised patient to continue with Penlac solution for her  nails and give her nails times to grow out may take a year and at this point because she has had multiple treatments of laser and has been on multiple medicines for fungus if this does not work then we may have to accept the fact that her nails may be always thick at the big toenails as well as the baby toenails -Mechanically debrided all nails 1-5 bilateral using sterile nail nipper and filed with dremel without incident  -Answered all patient questions -Patient to return  in 3 months for diabetic foot care -Patient advised to call the office if any problems or questions arise in the meantime.  Landis Martins, DPM

## 2021-06-27 NOTE — Progress Notes (Signed)
61 y.o. G80P1011 Married Caucasian female here for annual exam.   ? ?Lost 30 pounds on Contrave through her endocrinologist.  ?A1C 6.7.  ? ?Dealing with overactive bladder.  ?Took Ditropan in the past she felt like she was retaining urine.  ?Wants to try a new option.  ? ?PCP:  Alanson Aly, MD ? ?Patient's last menstrual period was 03/19/2007 (approximate).     ?  ?    ?Sexually active: Yes.    ?The current method of family planning is post menopausal status.    ?Exercising: Yes.     Treadmill and weights ?Smoker:  no ? ?Health Maintenance: ?Pap:  06-20-20 Neg:Neg HR HPV, 04-21-17 Neg:Neg HR HPV,  03/07/15 Neg:Neg HR HPV ?History of abnormal Pap:  yes,  Hx of LEEP in 1991 ?MMG:  08-11-20 Neg/Birads1 ?Colonoscopy:  2016 Neg cologuard, 2010 colonoscopy normal.  She will schedule with [facility].  ?BMD:  09-13-19  Result :Osteopenia ?TDaP:  PCP ?Gardasil:   n/a ?HIV: 08-07-06 NR ?Hep C: 08-07-06 Neg ?Screening Labs:  PCP and endocrinology.  ? ? reports that she has never smoked. She has never used smokeless tobacco. She reports that she does not drink alcohol and does not use drugs. ? ?Past Medical History:  ?Diagnosis Date  ? Abnormal Pap smear of cervix 1991  ? hx of LEEP procedure to cervix  ? Diabetes mellitus without complication ([place])   ? Hyperlipidemia   ? Hypertension   ? Neuropathy   ? in feet  ? Osteopenia   ? Sleep apnea   ? wears cpap  ? STD (sexually transmitted disease)   ? hx. HSV and condyloma/HPV  ? Urinary incontinence   ? ? ?Past Surgical History:  ?Procedure Laterality Date  ? BREAST REDUCTION SURGERY  1992  ? BREAST SURGERY    ? breast reduction  ? CERVICAL BIOPSY  W/ LOOP ELECTRODE EXCISION  1991  ? CERVICAL FUSION    ? CHOLECYSTECTOMY  2002  ? FOOT SURGERY  09/2012  ? right 5th metatarsal  ? ? ?Current Outpatient Medications  ?Medication Sig Dispense Refill  ? Accu-Chek FastClix Lancets MISC Use 6 times per day. E11.65    ? aspirin 81 MG tablet Take 81 mg by mouth daily.    ? calcium citrate-vitamin D  (CITRACAL+D) 315-200 MG-UNIT tablet Take 1 tablet by mouth 2 (two) times daily.    ? Cholecalciferol (VITAMIN D3) 3000 units TABS Take by mouth.    ? famotidine (PEPCID) 20 MG tablet Take 20 mg by mouth 2 (two) times daily.    ? glucosamine-chondroitin 500-400 MG tablet Take 1 tablet by mouth 3 (three) times daily.    ? Insulin Disposable Pump (OMNIPOD DASH 5 PACK) MISC     ? lisinopril (ZESTRIL) 40 MG tablet Take by mouth.    ? metFORMIN (GLUCOPHAGE) 1000 MG tablet Take 1 tablet by mouth 2 (two) times daily.    ? Multiple Vitamin (MULTIVITAMIN) tablet Take 1 tablet by mouth daily.    ? Naltrexone-buPROPion HCl (CONTRAVE PO)     ? NEXLETOL 180 MG TABS Take 1 tablet by mouth daily.    ? ONE TOUCH ULTRA TEST test strip     ? OZEMPIC, 1 MG/DOSE, 4 MG/3ML SOPN     ? vitamin C (ASCORBIC ACID) 500 MG tablet Take 500 mg by mouth daily.    ? ?No current facility-administered medications for this visit.  ? ? ?Family History  ?Problem Relation Age of Onset  ? Thyroid disease Mother   ?  Diabetes Father   ?     Type 2   ? Heart attack Father 55  ? Heart failure Father   ? Rheumatic fever Brother   ? ? ?Review of Systems  ?All other systems reviewed and are negative. ? ?Exam:   ?BP (!) 122/58   Pulse 82   Ht '5\' 6"'$  (1.676 m)   Wt 214 lb (97.1 kg)   LMP 03/19/2007 (Approximate)   SpO2 98%   BMI 34.54 kg/m?     ?General appearance: alert, cooperative and appears stated age ?Head: normocephalic, without obvious abnormality, atraumatic ?Neck: no adenopathy, supple, symmetrical, trachea midline and thyroid normal to inspection and palpation ?Lungs: clear to auscultation bilaterally ?Breasts: consistent with bilateral breast reduction, no masses or tenderness, No nipple retraction or dimpling, No nipple discharge or bleeding, No axillary adenopathy ?Heart: regular rate and rhythm ?Abdomen: soft, non-tender; no masses, no organomegaly ?Extremities: extremities normal, atraumatic, no cyanosis or edema ?Skin: skin color, texture,  turgor normal. No rashes or lesions ?Lymph nodes: cervical, supraclavicular, and axillary nodes normal. ?Neurologic: grossly normal ? ?Pelvic: External genitalia:  no lesions ?             No abnormal inguinal nodes palpated. ?             Urethra:  normal appearing urethra with no masses, tenderness or lesions ?             Bartholins and Skenes: normal    ?             Vagina: normal appearing vagina with normal color and discharge, no lesions ?             Cervix: no lesions ?             Pap taken: no ?Bimanual Exam:  Uterus:  normal size, contour, position, consistency, mobility, non-tender ?             Adnexa: no mass, fullness, tenderness ?             Rectal exam: yes.  Confirms. ?             Anus:  normal sphincter tone, no lesions ? ?Chaperone was present for exam:  Lovena Le, CMA ? ?Assessment:   ?Well woman visit with gynecologic exam. ?Hx bilateral breast reduction.  ?Hx mixed incontinence.  Overactive bladder symptoms.  ?History of LEEP and condyloma. ?Osteopenia and history of stress fracture. ?DM.  ?Successful weight loss.  ? ?Plan: ?Mammogram screening discussed. ?Self breast awareness reviewed. ?Pap and HR HPV 2025. ?Guidelines for Calcium, Vitamin D, regular exercise program including cardiovascular and weight bearing exercise. ?Rx for Myrbetriq 25 mg daily.  #30, RF 2.  She will check her blood pressure and report back to me how she is doing on this medication.  I can then give her refills if she wishes to continue this medication.  ?BMD ordered.   ?Follow up annually and prn.  ? ?After visit summary provided.  ? ? ? ?

## 2021-06-28 ENCOUNTER — Encounter: Payer: Self-pay | Admitting: Obstetrics and Gynecology

## 2021-06-28 ENCOUNTER — Ambulatory Visit: Payer: 59 | Admitting: Podiatry

## 2021-06-28 ENCOUNTER — Encounter: Payer: Self-pay | Admitting: Podiatry

## 2021-06-28 ENCOUNTER — Ambulatory Visit (INDEPENDENT_AMBULATORY_CARE_PROVIDER_SITE_OTHER): Payer: 59 | Admitting: Obstetrics and Gynecology

## 2021-06-28 VITALS — BP 122/58 | HR 82 | Ht 66.0 in | Wt 214.0 lb

## 2021-06-28 DIAGNOSIS — N3281 Overactive bladder: Secondary | ICD-10-CM | POA: Diagnosis not present

## 2021-06-28 DIAGNOSIS — M8580 Other specified disorders of bone density and structure, unspecified site: Secondary | ICD-10-CM

## 2021-06-28 DIAGNOSIS — B351 Tinea unguium: Secondary | ICD-10-CM | POA: Diagnosis not present

## 2021-06-28 DIAGNOSIS — M79675 Pain in left toe(s): Secondary | ICD-10-CM | POA: Diagnosis not present

## 2021-06-28 DIAGNOSIS — E119 Type 2 diabetes mellitus without complications: Secondary | ICD-10-CM | POA: Diagnosis not present

## 2021-06-28 DIAGNOSIS — Z01419 Encounter for gynecological examination (general) (routine) without abnormal findings: Secondary | ICD-10-CM

## 2021-06-28 DIAGNOSIS — M79674 Pain in right toe(s): Secondary | ICD-10-CM | POA: Diagnosis not present

## 2021-06-28 MED ORDER — MIRABEGRON ER 25 MG PO TB24: 25.0000 mg | ORAL_TABLET | Freq: Every day | ORAL | 2 refills | Status: DC

## 2021-06-28 NOTE — Patient Instructions (Signed)
EXERCISE AND DIET:  We recommended that you start or continue a regular exercise program for good health. Regular exercise means any activity that makes your heart beat faster and makes you sweat.  We recommend exercising at least 30 minutes per day at least 3 days a week, preferably 4 or 5.  We also recommend a diet low in fat and sugar.  Inactivity, poor dietary choices and obesity can cause diabetes, heart attack, stroke, and kidney damage, among others.   ? ?ALCOHOL AND SMOKING:  Women should limit their alcohol intake to no more than 7 drinks/beers/glasses of wine (combined, not each!) per week. Moderation of alcohol intake to this level decreases your risk of breast cancer and liver damage. And of course, no recreational drugs are part of a healthy lifestyle.  And absolutely no smoking or even second hand smoke. Most people know smoking can cause heart and lung diseases, but did you know it also contributes to weakening of your bones? Aging of your skin?  Yellowing of your teeth and nails? ? ?CALCIUM AND VITAMIN D:  Adequate intake of calcium and Vitamin D are recommended.  The recommendations for exact amounts of these supplements seem to change often, but generally speaking 600 mg of calcium (either carbonate or citrate) and 800 units of Vitamin D per day seems prudent. Certain women may benefit from higher intake of Vitamin D.  If you are among these women, your doctor will have told you during your visit.   ? ?PAP SMEARS:  Pap smears, to check for cervical cancer or precancers,  have traditionally been done yearly, although recent scientific advances have shown that most women can have pap smears less often.  However, every woman still should have a physical exam from her gynecologist every year. It will include a breast check, inspection of the vulva and vagina to check for abnormal growths or skin changes, a visual exam of the cervix, and then an exam to evaluate the size and shape of the uterus and  ovaries.  And after 63 years of age, a rectal exam is indicated to check for rectal cancers. We will also provide age appropriate advice regarding health maintenance, like when you should have certain vaccines, screening for sexually transmitted diseases, bone density testing, colonoscopy, mammograms, etc.  ? ?MAMMOGRAMS:  All women over 63 years old should have a yearly mammogram. Many facilities now offer a "3D" mammogram, which may cost around $50 extra out of pocket. If possible,  we recommend you accept the option to have the 3D mammogram performed.  It both reduces the number of women who will be called back for extra views which then turn out to be normal, and it is better than the routine mammogram at detecting truly abnormal areas.   ? ?COLONOSCOPY:  Colonoscopy to screen for colon cancer is recommended for all women at age 63.  We know, you hate the idea of the prep.  We agree, BUT, having colon cancer and not knowing it is worse!!  Colon cancer so often starts as a polyp that can be seen and removed at colonscopy, which can quite literally save your life!  And if your first colonoscopy is normal and you have no family history of colon cancer, most women don't have to have it again for 10 years.  Once every ten years, you can do something that may end up saving your life, right?  We will be happy to help you get it scheduled when you are ready.  Be sure to check your insurance coverage so you understand how much it will cost.  It may be covered as a preventative service at no cost, but you should check your particular policy.   ? ?Mirabegron Extended-release Oral Tablets ?What is this medication? ?MIRABEGRON (MIR a BEG ron) is used to treat overactive bladder. This medicine reduces the amount of bathroom visits. It may also help to control wetting accidents. ?This medicine may be used for other purposes; ask your health care provider or pharmacist if you have questions. ?COMMON BRAND NAME(S):  Myrbetriq ?What should I tell my care team before I take this medication? ?They need to know if you have any of these conditions: ?high blood pressure ?kidney disease ?liver disease ?problems urinating ?prostate disease ?an unusual or allergic reaction to mirabegron, other medicines, foods, dyes, or preservatives ?pregnant or trying to get pregnant ?breast-feeding ?How should I use this medication? ?Take this medicine by mouth with water. Take it as directed on the prescription label at the same time every day. Do not cut, crush or chew this medicine. Swallow the tablets whole. Adults can take it with or without food. Children should take it with food. Keep taking it unless your health care provider tells you to stop. ?Talk to your health care provider about the use of this medicine in children. While it may be prescribed for children as young as 3 years for selected conditions, precautions do apply. ?Overdosage: If you think you have taken too much of this medicine contact a poison control center or emergency room at once. ?NOTE: This medicine is only for you. Do not share this medicine with others. ?What if I miss a dose? ?If you miss a dose, take it as soon as you can unless it is more than 12 hours late. If it is more than 12 hours late, skip the missed dose. Take the next dose at the normal time. ?What may interact with this medication? ?codeine ?desipramine ?digoxin ?flecainide ?MAOIs like Carbex, Eldepryl, Marplan, Nardil, and Parnate ?methadone ?metoprolol ?pimozide ?propafenone ?thioridazine ?warfarin ?This list may not describe all possible interactions. Give your health care provider a list of all the medicines, herbs, non-prescription drugs, or dietary supplements you use. Also tell them if you smoke, drink alcohol, or use illegal drugs. Some items may interact with your medicine. ?What should I watch for while using this medication? ?Visit your health care provider for regular checks on your progress.  Tell your health care provider if your symptoms do not start to get better or if they get worse. Check your blood pressure as directed. Ask your doctor or health care professional what your blood pressure should be and when you should contact him or her. ?What side effects may I notice from receiving this medication? ?Side effects that you should report to your doctor or health care professional as soon as possible: ?allergic reactions (skin rash, itching or hives; swelling of the face, lips, or tongue) ?increase in blood pressure ?fast, irregular heartbeat ?infection (fever, chills, pain or trouble passing urine) ?trouble passing urine ?Side effects that usually do not require medical attention (report these to your doctor or health care professional if they continue or are bothersome): ?constipation ?dry mouth ?headache ?nausea ?sore throat ?This list may not describe all possible side effects. Call your doctor for medical advice about side effects. You may report side effects to FDA at 1-800-FDA-1088. ?Where should I keep my medication? ?Keep out of the reach of children and pets. ?Store at  room temperature between 20 and 25 degrees C (68 and 77 degrees F). Get rid of any unused medicine after the expiration date. ?To get rid of medicines that are no longer needed or have expired: ?Take the medicine to a medicine take-back program. Check with your pharmacy or law enforcement to find a location. ?If you cannot return the medicine, check the label or package insert to see if the medicine should be thrown out in the garbage or flushed down the toilet. If you are not sure, ask your health care provider. If it is safe to put it in the trash, empty the medicine out of the container. Mix the medicine with cat litter, dirt, coffee grounds, or other unwanted substance. Seal the mixture in a bag or container. Put it in the trash. ?NOTE: This sheet is a summary. It may not cover all possible information. If you have  questions about this medicine, talk to your doctor, pharmacist, or health care provider. ?? 2022 Elsevier/Gold Standard (2019-12-03 00:00:00) ? ?

## 2021-06-29 ENCOUNTER — Other Ambulatory Visit: Payer: Self-pay | Admitting: Obstetrics and Gynecology

## 2021-06-29 ENCOUNTER — Encounter: Payer: Self-pay | Admitting: Gastroenterology

## 2021-06-29 DIAGNOSIS — Z1231 Encounter for screening mammogram for malignant neoplasm of breast: Secondary | ICD-10-CM

## 2021-07-02 ENCOUNTER — Telehealth: Payer: Self-pay | Admitting: *Deleted

## 2021-07-02 MED ORDER — TOLTERODINE TARTRATE ER 4 MG PO CP24: 4.0000 mg | ORAL_CAPSULE | Freq: Every day | ORAL | 0 refills | Status: DC

## 2021-07-02 NOTE — Telephone Encounter (Signed)
Patient informed. Rx sent 

## 2021-07-02 NOTE — Telephone Encounter (Signed)
I recommend a trial of Detrol LA 4 mg.  ?Sig: 1 po q day.  ?Disp:  90 ?RF:  0 ? ?Medications to treat overactive bladder can result in urinary retention.  ?

## 2021-07-02 NOTE — Telephone Encounter (Signed)
Per [facility] patient will need to try/fail 3 formulary alternatives drugs from the below list before myrbetriq can be approved.  ?

## 2021-07-02 NOTE — Telephone Encounter (Signed)
PA done via cover my meds for Myrbetriq 25 mg tablet. Medication denied by [facility] patient will need to try/fail formulary alternatives: oxybutynin ext-rel, Darifenacin ext-rel, tropum ext-rel, fesoterodine ext-rel, Solifenacin, toterodine.  Please advise  ?

## 2021-07-02 NOTE — Telephone Encounter (Signed)
Patient stated she tried Ditropan in the past and had urinary retention. ?This was the reason for trying a completely different type of medication to treat her overactive bladder. ?

## 2021-07-06 ENCOUNTER — Telehealth: Payer: Self-pay | Admitting: *Deleted

## 2021-07-06 NOTE — Telephone Encounter (Signed)
Please obtain insulin pump instructions from Dr.Doerr for colonoscopy with Dr.Beavers on 08/22/2021.Thank you! Previsit ?

## 2021-07-07 NOTE — Progress Notes (Signed)
?  Subjective:  ?Patient ID: Donna Krause, female    DOB: 02-Sep-1960,  MRN: [id number] ? ?Donna Krause presents to clinic today for preventative diabetic foot care and thick, elongated toenails both feet which are tender when wearing enclosed shoe gear. ? ?Patient states blood glucose was 119 mg/dl yesterday.  Last HgA1c was 6.7%. ? ?New problem(s): None.  ? ?Patient states prior treatments for nail fungus includes laser therapy 4 years ago and again one year ago. She is using Penlac Nail Lacquer.  ? ?PCP is Algis Greenhouse, MD , and last visit was May 18, 2021. ? ?Allergies  ?Allergen Reactions  ? Dapagliflozin Nausea And Vomiting and Other (See Comments)  ?  dizziness  ? ? ?Review of Systems: Negative except as noted in the HPI. ? ?Objective: No changes noted in today's physical examination. ?Objective:  ? ?Vascular Examination: ?Vascular status intact b/l with palpable pedal pulses. Pedal hair present b/l. CFT immediate b/l. No edema. No pain with calf compression b/l. Skin temperature gradient WNL b/l.  ? ?Neurological Examination: ?Sensation grossly intact b/l with 10 gram monofilament. Vibratory sensation intact b/l. Pt has subjective symptoms of neuropathy.  ? ?Dermatological Examination: ?Pedal skin with normal turgor, texture and tone b/l. Toenails 1-5 b/l thick, discolored, elongated with subungual debris and pain on dorsal palpation. No hyperkeratotic lesions noted b/l.  ? ?Musculoskeletal Examination: ?Muscle strength 5/5 to b/l LE. HAV with bunion deformity noted b/l LE. Hammertoe deformity noted 2-5 b/l. ? ?Radiographs: None ? ?Assessment/Plan: ?1. Pain due to onychomycosis of toenails of both feet   ?2. Diabetes mellitus without complication ([place])   ?  ?-Patient was evaluated and treated. All patient's and/or POA's questions/concerns answered on today's visit. ?-Patient refused diabetic foot examination today as her PCP performed one in March per patient, so it was postponed  today. ?-Discussed treatment options for onychomycosis. She has attempted laser therapy and failed. Does not want oral therapy at this time.Patient opted for topical OTC therapy. Discontinue Penlac Nail Lacquer. Patient is to apply 1 drop of tea tree oil to affected toenail(s) once daily. ?-Mycotic toenails 1-5 bilaterally were debrided in length and girth with sterile nail nippers and dremel without incident. ?-Patient/POA to call should there be question/concern in the interim.  ? ?Return in about 3 months (around 09/27/2021). ? ?Marzetta Board, DPM  ?

## 2021-07-12 NOTE — Telephone Encounter (Signed)
Letter faxed.

## 2021-07-17 ENCOUNTER — Ambulatory Visit (AMBULATORY_SURGERY_CENTER): Payer: 59 | Admitting: *Deleted

## 2021-07-17 VITALS — Ht 66.0 in | Wt 214.0 lb

## 2021-07-17 DIAGNOSIS — Z1211 Encounter for screening for malignant neoplasm of colon: Secondary | ICD-10-CM

## 2021-07-17 MED ORDER — NA SULFATE-K SULFATE-MG SULF 17.5-3.13-1.6 GM/177ML PO SOLN: 1.0000 | ORAL | 0 refills | Status: DC

## 2021-07-17 NOTE — Progress Notes (Signed)
Patient's pre-visit was done today over the phone with the patient. Name,DOB and address verified. Patient denies any allergies to Eggs and Soy. Patient denies any problems with anesthesia/sedation. Patient is not taking any diet pills-Phentermine or blood thinners. No home Oxygen. Insurance confirmed with patient. ? ?Prep instructions sent to pt's MyChart (if available) or mailed to pt-pt is aware. Patient understands to call us back with any questions or concerns. Patient is aware of our care-partner policy. Patient aware we will contact Dr.Doerr for insulin pump instructions and then call her back. ? ?EMMI education assigned to the patient for the procedure, sent to [facility].  ? ?The patient is COVID-19 vaccinated.   ?

## 2021-07-17 NOTE — Telephone Encounter (Signed)
Pt sees Dr Alanson Aly in [facility] ? ?Thank you! ? ?Previsit ?

## 2021-07-17 NOTE — Telephone Encounter (Signed)
Please confirm patient's endocrinologist for her insulin pump clearance. ?

## 2021-07-20 NOTE — Telephone Encounter (Signed)
Call Dr. Meredith Pel office and got the corrected fax number. Refaxed letter to her office. ?

## 2021-07-26 NOTE — Telephone Encounter (Signed)
Spoke with patient's endocrinologist confirmed they received fax. ?

## 2021-08-08 NOTE — Telephone Encounter (Signed)
Heather,  Have you received any return letter with insulin pump hold for this pt?  Her Colon is 6-7    Thx  Marie PV

## 2021-08-15 NOTE — Telephone Encounter (Signed)
Spoke with Museum/gallery conservator at Dr. Meredith Pel office and she has still not signed off on insulin pump instructions.

## 2021-08-16 NOTE — Telephone Encounter (Signed)
Per Dr. Meredith Pel patient is to set rate at 80% of usual dose 12 hour before procedure and return to normal settings once eating again. Patient informed by Dr. Meredith Pel office.

## 2021-08-20 ENCOUNTER — Encounter: Payer: Self-pay | Admitting: Gastroenterology

## 2021-08-21 ENCOUNTER — Ambulatory Visit: Payer: 59

## 2021-08-21 ENCOUNTER — Ambulatory Visit
Admission: RE | Admit: 2021-08-21 | Discharge: 2021-08-21 | Disposition: A | Discharge status: Home / Self Care | Payer: 59 | Source: Ambulatory Visit | Attending: Obstetrics and Gynecology | Admitting: Obstetrics and Gynecology

## 2021-08-21 DIAGNOSIS — Z1231 Encounter for screening mammogram for malignant neoplasm of breast: Secondary | ICD-10-CM

## 2021-08-22 ENCOUNTER — Ambulatory Visit (AMBULATORY_SURGERY_CENTER): Payer: 59 | Admitting: Gastroenterology

## 2021-08-22 ENCOUNTER — Encounter: Payer: Self-pay | Admitting: Gastroenterology

## 2021-08-22 VITALS — BP 132/70 | HR 69 | Temp 98.0°F | Resp 15 | Ht 66.0 in | Wt 214.0 lb

## 2021-08-22 DIAGNOSIS — D123 Benign neoplasm of transverse colon: Secondary | ICD-10-CM

## 2021-08-22 DIAGNOSIS — Z1211 Encounter for screening for malignant neoplasm of colon: Secondary | ICD-10-CM | POA: Diagnosis present

## 2021-08-22 MED ORDER — SODIUM CHLORIDE 0.9 % IV SOLN: 500.0000 mL | Freq: Once | INTRAVENOUS | Status: DC

## 2021-08-22 NOTE — Progress Notes (Deleted)
Called to room to assist during endoscopic procedure.  Patient ID and intended procedure confirmed with present staff. Received instructions for my participation in the procedure from the performing physician.  

## 2021-08-22 NOTE — Progress Notes (Signed)
PT taken to PACU. Monitors in place. VSS. Report given to RN. 

## 2021-08-22 NOTE — Patient Instructions (Signed)
Please read handouts provided. Continue present medications. Await pathology results. High Fiber Diet.   YOU HAD AN ENDOSCOPIC PROCEDURE TODAY AT [facility]:   Refer to the procedure report that was given to you for any specific questions about what was found during the examination.  If the procedure report does not answer your questions, please call your gastroenterologist to clarify.  If you requested that your care partner not be given the details of your procedure findings, then the procedure report has been included in a sealed envelope for you to review at your convenience later.  YOU SHOULD EXPECT: Some feelings of bloating in the abdomen. Passage of more gas than usual.  Walking can help get rid of the air that was put into your GI tract during the procedure and reduce the bloating. If you had a lower endoscopy (such as a colonoscopy or flexible sigmoidoscopy) you may notice spotting of blood in your stool or on the toilet paper. If you underwent a bowel prep for your procedure, you may not have a normal bowel movement for a few days.  Please Note:  You might notice some irritation and congestion in your nose or some drainage.  This is from the oxygen used during your procedure.  There is no need for concern and it should clear up in a day or so.  SYMPTOMS TO REPORT IMMEDIATELY:  Following lower endoscopy (colonoscopy or flexible sigmoidoscopy):  Excessive amounts of blood in the stool  Significant tenderness or worsening of abdominal pains  Swelling of the abdomen that is new, acute  Fever of 100F or higher  For urgent or emergent issues, a gastroenterologist can be reached at any hour by calling [phone]. Do not use MyChart messaging for urgent concerns.    DIET:  We do recommend a small meal at first, but then you may proceed to your regular diet.  Drink plenty of fluids but you should avoid alcoholic beverages for 24 hours.  ACTIVITY:  You should plan to  take it easy for the rest of today and you should NOT DRIVE or use heavy machinery until tomorrow (because of the sedation medicines used during the test).    FOLLOW UP: Our staff will call the number listed on your records 24-72 hours following your procedure to check on you and address any questions or concerns that you may have regarding the information given to you following your procedure. If we do not reach you, we will leave a message.  We will attempt to reach you two times.  During this call, we will ask if you have developed any symptoms of COVID 19. If you develop any symptoms (ie: fever, flu-like symptoms, shortness of breath, cough etc.) before then, please call [phone].  If you test positive for Covid 19 in the 2 weeks post procedure, please call and report this information to [place].    If any biopsies were taken you will be contacted by phone or by letter within the next 1-3 weeks.  Please call us at [phone] if you have not heard about the biopsies in 3 weeks.    SIGNATURES/CONFIDENTIALITY: You and/or your care partner have signed paperwork which will be entered into your electronic medical record.  These signatures attest to the fact that that the information above on your After Visit Summary has been reviewed and is understood.  Full responsibility of the confidentiality of this discharge information lies with you and/or your care-partner.

## 2021-08-22 NOTE — Progress Notes (Signed)
Referring Provider: Algis Greenhouse, MD Primary Care Physician:  Algis Greenhouse, MD  Indication for Procedure:  Colon cancer Surveillance   IMPRESSION:  Need for colon cancer surveillance Appropriate candidate for monitored anesthesia care  PLAN: Colonoscopy in the [place] today   HPI: Donna Krause is a 61 y.o. female presents for surveillance colonoscopy.  Prior endoscopic history: Colonoscopy 2010 was normal  Cologuard negative 2016.   No baseline GI symptoms.     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 E11.65     aspirin 81 MG tablet Take 81 mg by mouth daily.     calcium citrate-vitamin D (CITRACAL+D) 315-200 MG-UNIT tablet Take 1 tablet by mouth 2 (two) times daily.     Cholecalciferol (VITAMIN D3) 3000 units TABS Take by mouth.     famotidine (PEPCID) 20 MG tablet Take 20 mg by mouth as needed.     glucosamine-chondroitin 500-400 MG tablet Take 1 tablet by mouth 3 (three) times daily.     Insulin Disposable Pump (OMNIPOD DASH 5 PACK) MISC      lisinopril (ZESTRIL) 40 MG tablet Take by mouth.     metFORMIN (GLUCOPHAGE) 1000 MG tablet Take 1 tablet by mouth 2 (two) times daily.     Multiple Vitamin  (MULTIVITAMIN) tablet Take 1 tablet by mouth daily.     Na Sulfate-K Sulfate-Mg Sulf 17.5-3.13-1.6 GM/177ML SOLN Take 1 kit by mouth as directed. May use generic SUPREP;NO prior authorizations will be done.Please use Singlecare or GOOD-RX coupon. 354 mL 0   Naltrexone-buPROPion HCl (CONTRAVE PO)      NEXLETOL 180 MG TABS Take 1 tablet by mouth daily.     NOVOLOG 100 UNIT/ML injection Inject into the skin.     ONE TOUCH ULTRA TEST test strip      OZEMPIC, 1 MG/DOSE, 4 MG/3ML SOPN      tolterodine (DETROL LA) 4 MG 24 hr capsule Take 1 capsule (4 mg total) by mouth daily. 90 capsule 0   vitamin C (ASCORBIC ACID) 500 MG tablet Take 500 mg by mouth daily.     Current Facility-Administered Medications  Medication Dose Route Frequency Provider Last Rate Last Admin   0.9 %  sodium chloride infusion  500 mL Intravenous Once Thornton Park, MD        Allergies as of 08/22/2021 - Review Complete 07/17/2021  Allergen Reaction Noted   Dapagliflozin Nausea And Vomiting and Other (See Comments) 06/06/2017    Family History  Problem Relation Age of Onset   Thyroid disease Mother  Diabetes Father        Type 2    Heart attack Father 39   Heart failure Father    Prostate cancer Father    Rheumatic fever Brother    Colon cancer Maternal Aunt 80   Stomach cancer Neg Hx    Esophageal cancer Neg Hx      Physical Exam: General:   Alert,  well-nourished, pleasant and cooperative in NAD Head:  Normocephalic and atraumatic. Eyes:  Sclera clear, no icterus.   Conjunctiva pink. Mouth:  No deformity or lesions.   Neck:  Supple; no masses or thyromegaly. Lungs:  Clear throughout to auscultation.   No wheezes. Heart:  Regular rate and rhythm; no murmurs. Abdomen:  Soft, non-tender, nondistended, normal bowel sounds, no rebound or guarding.  Msk:  Symmetrical. No boney deformities LAD: No inguinal or umbilical LAD Extremities:  No clubbing or edema. Neurologic:  Alert and  oriented x4;  grossly  nonfocal Skin:  No obvious rash or bruise. Psych:  Alert and cooperative. Normal mood and affect.     Studies/Results: No results found.    Donna Poynor L. Tarri Glenn, MD, MPH 08/22/2021, 9:21 AM

## 2021-08-22 NOTE — Op Note (Signed)
[facility] Patient Name: Donna Krause Procedure Date: 08/22/2021 10:08 AM MRN: [id number] Endoscopist: Thornton Park MD, MD Age: 61 Referring MD:  Date of Birth: 24-Aug-1960 Gender: Female Account #: [id number] Procedure:                Colonoscopy Indications:              Screening for colorectal malignant neoplasm Medicines:                Monitored Anesthesia Care Procedure:                Pre-Anesthesia Assessment:                           - Prior to the procedure, a History and Physical                            was performed, and patient medications and                            allergies were reviewed. The patient's tolerance of                            previous anesthesia was also reviewed. The risks                            and benefits of the procedure and the sedation                            options and risks were discussed with the patient.                            All questions were answered, and informed consent                            was obtained. Prior Anticoagulants: The patient has                            taken no previous anticoagulant or antiplatelet                            agents. ASA Grade Assessment: II - A patient with                            mild systemic disease. After reviewing the risks                            and benefits, the patient was deemed in                            satisfactory condition to undergo the procedure.                           After obtaining informed consent, the colonoscope  was passed under direct vision. Throughout the                            procedure, the patient's blood pressure, pulse, and                            oxygen saturations were monitored continuously. The                            CF [id number] #[id number] was introduced through the anus                            and advanced to the 3 cm into the ileum. A second                            forward  view of the right colon was performed. The                            colonoscopy was performed without difficulty. The                            patient tolerated the procedure well. The quality                            of the bowel preparation was good. The terminal                            ileum, ileocecal valve, appendiceal orifice, and                            rectum were photographed. Scope In: 10:21:28 AM Scope Out: 10:36:56 AM Scope Withdrawal Time: 0 hours 11 minutes 0 seconds  Total Procedure Duration: 0 hours 15 minutes 28 seconds  Findings:                 The perianal and digital rectal examinations were                            normal.                           Non-bleeding internal hemorrhoids were found.                           A 2 mm polyp was found in the splenic flexure. The                            polyp was sessile. The polyp was removed with a                            cold snare. Resection and retrieval were complete.                            Estimated blood loss was minimal.  The exam was otherwise without abnormality on                            direct and retroflexion views. Complications:            No immediate complications. Estimated Blood Loss:     Estimated blood loss was minimal. Impression:               - Non-bleeding internal hemorrhoids.                           - One 2 mm polyp at the splenic flexure, removed                            with a cold snare. Resected and retrieved.                           - The examination was otherwise normal on direct                            and retroflexion views. Recommendation:           - Patient has a contact number available for                            emergencies. The signs and symptoms of potential                            delayed complications were discussed with the                            patient. Return to normal activities tomorrow.                             Written discharge instructions were provided to the                            patient.                           - High fiber diet.                           - Continue present medications.                           - Await pathology results.                           - Repeat colonoscopy date to be determined after                            pending pathology results are reviewed for                            surveillance.                           -  Emerging evidence supports eating a diet of                            fruits, vegetables, grains, calcium, and yogurt                            while reducing red meat and alcohol may reduce the                            risk of colon cancer. Thornton Park MD, MD 08/22/2021 10:43:15 AM This report has been signed electronically.

## 2021-08-22 NOTE — Progress Notes (Signed)
VS completed by CW.   Pt's states no medical or surgical changes since previsit or office visit.  

## 2021-08-23 ENCOUNTER — Telehealth: Payer: Self-pay

## 2021-08-23 NOTE — Telephone Encounter (Signed)
  Follow up Call-     08/22/2021    9:17 AM  Call back number  Post procedure Call Back phone  # [phone]  Permission to leave phone message Yes     Patient questions:  Do you have a fever, pain , or abdominal swelling? No. Pain Score  0 *  Have you tolerated food without any problems? Yes.    Have you been able to return to your normal activities? Yes.    Do you have any questions about your discharge instructions: Diet   No. Medications  No. Follow up visit  No.  Do you have questions or concerns about your Care? No.  Actions: * If pain score is 4 or above: No action needed, pain <4.

## 2021-08-27 ENCOUNTER — Encounter: Payer: Self-pay | Admitting: Gastroenterology

## 2021-09-20 ENCOUNTER — Encounter: Payer: Self-pay | Admitting: Podiatry

## 2021-09-20 ENCOUNTER — Ambulatory Visit: Payer: 59 | Admitting: Podiatry

## 2021-09-20 DIAGNOSIS — B351 Tinea unguium: Secondary | ICD-10-CM

## 2021-09-20 DIAGNOSIS — M79675 Pain in left toe(s): Secondary | ICD-10-CM

## 2021-09-20 DIAGNOSIS — M79674 Pain in right toe(s): Secondary | ICD-10-CM

## 2021-09-20 DIAGNOSIS — E119 Type 2 diabetes mellitus without complications: Secondary | ICD-10-CM

## 2021-09-27 NOTE — Progress Notes (Signed)
  Subjective:  Patient ID: Donna Krause, female    DOB: 1961-03-01,  MRN: [id number]  Carole Doner presents to clinic today for preventative diabetic foot care and painful thick toenails that are difficult to trim. Pain interferes with ambulation. Aggravating factors include wearing enclosed shoe gear. Pain is relieved with periodic professional debridement.  Patient states blood glucose was 120 mg/dl today.  Last A1c was 6.8%.  She would like to revisit treatment options for onychomycosis on today's visit. Patient states prior treatments for nail fungus includes laser therapy 4 years ago and again one year ago. She is using [employer]lac Nail Lacquer.   PCP is Algis Greenhouse, MD , and last visit was May 18, 2021.  Review of Systems: Negative except as noted in the HPI.  Allergies  Allergen Reactions   Dapagliflozin Nausea And Vomiting and Other (See Comments)    dizziness   Objective: No changes noted in today's physical examination.  Vascular Examination: Vascular status intact b/l with palpable pedal pulses. Pedal hair present b/l. CFT immediate b/l. No edema. No pain with calf compression b/l. Skin temperature gradient WNL b/l.   Neurological Examination: Sensation grossly intact b/l with 10 gram monofilament. Vibratory sensation intact b/l. Pt has subjective symptoms of neuropathy.   Dermatological Examination: Pedal skin with normal turgor, texture and tone b/l. Toenails 1-5 b/l thick, discolored, elongated with subungual debris and pain on dorsal palpation. No hyperkeratotic lesions noted b/l.   Musculoskeletal Examination: Muscle strength 5/5 to b/l LE. HAV with bunion deformity noted b/l LE. Hammertoe deformity noted 2-5 b/l.  Radiographs: None  Assessment/Plan: 1. Pain due to onychomycosis of toenails of both feet   2. Diabetes mellitus without complication ([place])    -Examined patient. -Discussed topical, laser and oral medication. Patient opted for laser  therapy. Discussed cost and treatment schedule. Patient will schedule first session at his/her convenience. -Toenails 1-5 b/l were debrided in length and girth with sterile nail nippers and dremel without iatrogenic bleeding.  -Patient/POA to call should there be question/concern in the interim.   Return in about 3 months (around 12/21/2021).  Marzetta Board, DPM

## 2021-10-01 ENCOUNTER — Ambulatory Visit (INDEPENDENT_AMBULATORY_CARE_PROVIDER_SITE_OTHER): Payer: Self-pay

## 2021-10-01 DIAGNOSIS — L603 Nail dystrophy: Secondary | ICD-10-CM

## 2021-10-01 NOTE — Patient Instructions (Signed)

## 2021-10-01 NOTE — Progress Notes (Signed)
Patient presents today for the 1st laser treatment. Diagnosed with mycotic nail infection by Dr. Elisha Ponder.   Toenail most affected 1st bilateral.  All other systems are negative.  Nails were filed thin. Laser therapy was administered to 1-5 toenails bilateral and patient tolerated the treatment well. All safety precautions were in place.    Follow up in 4 weeks for laser # 2.

## 2021-10-02 ENCOUNTER — Other Ambulatory Visit: Payer: Self-pay

## 2021-10-02 NOTE — Telephone Encounter (Signed)
Last annual exam on 06/28/21.   Called pt to see how well she is doing on this overactive bladder medication before sending refill. No answer, left DVM per DPR for pt to return call with response.

## 2021-10-05 ENCOUNTER — Other Ambulatory Visit: Payer: Self-pay | Admitting: Obstetrics and Gynecology

## 2021-10-08 MED ORDER — TOLTERODINE TARTRATE ER 4 MG PO CP24
4.0000 mg | ORAL_CAPSULE | Freq: Every day | ORAL | 2 refills | Status: DC
Start: 2021-10-08 — End: 2021-10-29

## 2021-10-08 NOTE — Telephone Encounter (Signed)
Annual exam 06/2021

## 2021-10-26 NOTE — Telephone Encounter (Signed)
FYI. No returned call from pt.

## 2021-10-29 MED ORDER — TOLTERODINE TARTRATE ER 4 MG PO CP24: 4.0000 mg | ORAL_CAPSULE | Freq: Every day | ORAL | 2 refills | Status: DC

## 2021-11-30 ENCOUNTER — Ambulatory Visit (INDEPENDENT_AMBULATORY_CARE_PROVIDER_SITE_OTHER): Payer: Self-pay

## 2021-11-30 DIAGNOSIS — B351 Tinea unguium: Secondary | ICD-10-CM

## 2021-11-30 DIAGNOSIS — L603 Nail dystrophy: Secondary | ICD-10-CM

## 2021-11-30 NOTE — Progress Notes (Signed)
Patient presents today for the  laser treatment. Diagnosed with mycotic nail infection by Dr. Elisha Ponder.   Toenail most affected 1st bilateral.  All other systems are negative.  Nails were filed thin. Laser therapy was administered to 1-5 toenails bilateral and patient tolerated the treatment well. All safety precautions were in place.    Follow up in 4 weeks for laser # 2.

## 2021-12-25 ENCOUNTER — Encounter: Payer: Self-pay | Admitting: Obstetrics and Gynecology

## 2021-12-25 ENCOUNTER — Other Ambulatory Visit: Payer: 59

## 2021-12-25 ENCOUNTER — Ambulatory Visit
Admission: RE | Admit: 2021-12-25 | Discharge: 2021-12-25 | Disposition: A | Discharge status: Home / Self Care | Payer: 59 | Source: Ambulatory Visit | Attending: Obstetrics and Gynecology | Admitting: Obstetrics and Gynecology

## 2021-12-25 DIAGNOSIS — M8580 Other specified disorders of bone density and structure, unspecified site: Secondary | ICD-10-CM

## 2021-12-27 ENCOUNTER — Ambulatory Visit (INDEPENDENT_AMBULATORY_CARE_PROVIDER_SITE_OTHER): Payer: 59 | Admitting: Podiatry

## 2021-12-27 ENCOUNTER — Encounter: Payer: Self-pay | Admitting: Podiatry

## 2021-12-27 DIAGNOSIS — M79674 Pain in right toe(s): Secondary | ICD-10-CM

## 2021-12-27 DIAGNOSIS — M79675 Pain in left toe(s): Secondary | ICD-10-CM

## 2021-12-27 DIAGNOSIS — M2042 Other hammer toe(s) (acquired), left foot: Secondary | ICD-10-CM | POA: Diagnosis not present

## 2021-12-27 DIAGNOSIS — Z794 Long term (current) use of insulin: Secondary | ICD-10-CM | POA: Diagnosis not present

## 2021-12-27 DIAGNOSIS — M2011 Hallux valgus (acquired), right foot: Secondary | ICD-10-CM

## 2021-12-27 DIAGNOSIS — M2012 Hallux valgus (acquired), left foot: Secondary | ICD-10-CM | POA: Diagnosis not present

## 2021-12-27 DIAGNOSIS — E1165 Type 2 diabetes mellitus with hyperglycemia: Secondary | ICD-10-CM

## 2021-12-27 DIAGNOSIS — M2041 Other hammer toe(s) (acquired), right foot: Secondary | ICD-10-CM

## 2021-12-27 DIAGNOSIS — B351 Tinea unguium: Secondary | ICD-10-CM | POA: Diagnosis not present

## 2021-12-27 NOTE — Progress Notes (Signed)
  Subjective:  Patient ID: Donna Krause, female    DOB: February 05, 1961,  MRN: [id number]  Donna Krause presents to clinic today for:  Chief Complaint  Patient presents with   Nail Problem    Diabetic foot care BS-130 A1C-7.1 PCP-Robert Dough PCP VST-2021   New problem(s): None.   PCP is Dough, Jaymes Graff, MD.  Patient currently undergoing laser therapy for onychomycosis.   Attempted treatments for mycotics toenails include: oral terbinafine, Formula 3, Ciclopirox solution, laser therapy. This is her 2nd round of laser therapy. Allergies  Allergen Reactions   Dapagliflozin Nausea And Vomiting and Other (See Comments)    dizziness   Review of Systems: Negative except as noted in the HPI.  Objective: No changes noted in today's physical examination.  [name] is a pleasant 61 y.o. female in NAD. AAO x 3.  Vascular Examination: Vascular status intact b/l with palpable pedal pulses. Pedal hair present b/l. CFT immediate b/l. No edema. No pain with calf compression b/l. Skin temperature gradient WNL b/l.   Neurological Examination: Sensation grossly intact b/l with 10 gram monofilament. Vibratory sensation intact b/l. Pt has subjective symptoms of neuropathy.   Dermatological Examination: Pedal skin with normal turgor, texture and tone b/l. Improvement in lesser toenails. Bilateral great toenails b/l thick, discolored, elongated with subungual debris. There is clearing proximal 1/3 of nailplates. No hyperkeratotic lesions noted b/l.   Musculoskeletal Examination: Muscle strength 5/5 to b/l LE. HAV with bunion deformity noted b/l LE left >right. Hammertoe deformity noted 2-5 b/l.  Radiographs: None Assessment/Plan: 1. Pain due to onychomycosis of toenails of both feet   2. Hallux valgus, acquired, bilateral   3. Acquired hammertoes of both feet   4. Type 2 diabetes mellitus with hyperglycemia, with long-term current use of insulin ([facility])     No orders  of the defined types were placed in this encounter.   -Consent given for treatment as described below: -Examined patient. -Continue laser therapy for onychomycosis in [place]. Advised her to start tea tree oil once daily to affected toenails. -Toenails bilateral great toes debrided in length and girth without iatrogenic bleeding with sterile nail nipper and dremel.  -Discussed conservative vs surgical intervention for bunion and hammertoe deformity. She is currently asymptomatic, but we did discuss her progressive deformity of her left foot. Continue wide, supportive shoe gear for now. -Patient/POA to call should there be question/concern in the interim.   Return in about 3 months (around 03/29/2022).  Marzetta Board, DPM

## 2022-01-11 ENCOUNTER — Ambulatory Visit (INDEPENDENT_AMBULATORY_CARE_PROVIDER_SITE_OTHER): Payer: 59

## 2022-01-11 DIAGNOSIS — B351 Tinea unguium: Secondary | ICD-10-CM

## 2022-01-11 DIAGNOSIS — L603 Nail dystrophy: Secondary | ICD-10-CM

## 2022-01-11 NOTE — Progress Notes (Signed)
Patient presents today for the  3rd laser treatment. Diagnosed with mycotic nail infection by Dr. Elisha Ponder.   Toenail most affected 1st bilateral.  All other systems are negative.  Nails were filed thin. Laser therapy was administered to 1-5 toenails bilateral and patient tolerated the treatment well. All safety precautions were in place.    Follow up in 6 weeks for laser # 4.

## 2022-02-22 ENCOUNTER — Ambulatory Visit (INDEPENDENT_AMBULATORY_CARE_PROVIDER_SITE_OTHER): Payer: 59 | Admitting: *Deleted

## 2022-02-22 DIAGNOSIS — L603 Nail dystrophy: Secondary | ICD-10-CM

## 2022-02-22 NOTE — Progress Notes (Signed)
Patient presents today for the 4th laser treatment. Diagnosed with mycotic nail infection by Dr. Elisha Ponder.   Toenail most affected 1st bilateral.  All other systems are negative.  Nails were filed thin. Laser therapy was administered to 1-5 toenails left only at this time.   The laser cord malfunctioned while working on the nails of the right foot.   Advised to go ahead and schedule for laser #5 in 6 weeks and the office will call to complete the right foot as soon as we get the new laser cord in.

## 2022-02-25 ENCOUNTER — Other Ambulatory Visit: Payer: 59

## 2022-04-05 ENCOUNTER — Ambulatory Visit (INDEPENDENT_AMBULATORY_CARE_PROVIDER_SITE_OTHER): Payer: Self-pay

## 2022-04-05 DIAGNOSIS — L603 Nail dystrophy: Secondary | ICD-10-CM

## 2022-04-05 NOTE — Progress Notes (Signed)
Patient presents today for the 4th (repeat due to cord malfunction at last treatment) laser treatment. Diagnosed with mycotic nail infection by Dr. Elisha Ponder.   Toenail most affected 1st bilateral.  All other systems are negative.  Nails were filed thin. Laser therapy was administered to 1-5 toenails bilateral and patient tolerated the treatment well. All safety precautions were in place.    Follow up in 6 weeks for laser # 5.

## 2022-04-11 ENCOUNTER — Ambulatory Visit: Payer: 59 | Admitting: Podiatry

## 2022-05-17 ENCOUNTER — Ambulatory Visit (INDEPENDENT_AMBULATORY_CARE_PROVIDER_SITE_OTHER): Payer: Self-pay

## 2022-05-17 DIAGNOSIS — L603 Nail dystrophy: Secondary | ICD-10-CM

## 2022-05-17 NOTE — Progress Notes (Signed)
Patient presents today for the 5th laser treatment. Diagnosed with mycotic nail infection by Dr. Elisha Ponder.   Toenail most affected 1st bilateral.  All other systems are negative.  Nails were filed thin. Laser therapy was administered to 1-5 toenails bilateral and patient tolerated the treatment well. All safety precautions were in place.    Follow up in 8 weeks for laser # 6.

## 2022-07-09 ENCOUNTER — Other Ambulatory Visit: Payer: Self-pay | Admitting: Obstetrics and Gynecology

## 2022-07-09 DIAGNOSIS — Z1231 Encounter for screening mammogram for malignant neoplasm of breast: Secondary | ICD-10-CM

## 2022-07-12 ENCOUNTER — Ambulatory Visit (INDEPENDENT_AMBULATORY_CARE_PROVIDER_SITE_OTHER): Payer: 59

## 2022-07-12 VITALS — BP 155/88 | HR 77

## 2022-07-12 DIAGNOSIS — L603 Nail dystrophy: Secondary | ICD-10-CM

## 2022-07-12 NOTE — Progress Notes (Signed)
Patient presents today for the 5th laser treatment. Diagnosed with mycotic nail infection by Dr. Eloy End.   Toenail most affected 1st bilateral.  All other systems are negative.  Nails were filed thin. Laser therapy was administered to 1-5 toenails bilateral and patient tolerated the treatment well. All safety precautions were in place.    Follow up in 8 weeks for laser # 7.  Patient affected nails are halfway grown out. I think an additional treatments will be beneficial before her following up with Dr. Eloy End.

## 2022-07-12 NOTE — Patient Instructions (Signed)

## 2022-09-03 ENCOUNTER — Ambulatory Visit
Admission: RE | Admit: 2022-09-03 | Discharge: 2022-09-03 | Disposition: A | Discharge status: Home / Self Care | Payer: 59 | Source: Ambulatory Visit | Attending: Obstetrics and Gynecology | Admitting: Obstetrics and Gynecology

## 2022-09-03 DIAGNOSIS — Z1231 Encounter for screening mammogram for malignant neoplasm of breast: Secondary | ICD-10-CM

## 2022-09-06 ENCOUNTER — Ambulatory Visit (INDEPENDENT_AMBULATORY_CARE_PROVIDER_SITE_OTHER): Payer: 59 | Admitting: *Deleted

## 2022-09-06 DIAGNOSIS — L603 Nail dystrophy: Secondary | ICD-10-CM

## 2022-09-06 NOTE — Progress Notes (Signed)
Patient presents today for the 7th laser treatment. Diagnosed with mycotic nail infection by Dr. Eloy End.   Toenail most affected 1st bilateral.  All other systems are negative.  Nails were filed thin. Laser therapy was administered to 1-5 toenails bilateral and patient tolerated the treatment well. All safety precautions were in place.   She will follow up with Dr. Eloy End in 1 month, then schedule for 3 months to start laser maintenance.

## 2022-09-10 NOTE — Progress Notes (Deleted)
62 y.o. G85P1011 Married Caucasian female here for annual exam.    PCP:     Patient's last menstrual period was 03/19/2007 (approximate).           Sexually active: {yes no:314532}  The current method of family planning is post menopausal status.    Exercising: {yes no:314532}  {types:19826} Smoker:  no  Health Maintenance: Pap:  06/20/20 neg: HR HPV neg, 04/21/17 neg: HR HPV neg History of abnormal Pap:  yes, Hx of LEEP in 1991  MMG:  09/03/22 Breast Density Cat A, BI-RADS CAT 1 neg Colonoscopy:  08/22/21 BMD:   12/25/21  Result  osteopenic TDaP:  PCP Gardasil:   no HIV: 08/07/06 NR Hep C: 08/07/06 neg Screening Labs:  Hb today: ***, Urine today: ***   reports that she has never smoked. She has never used smokeless tobacco. She reports that she does not drink alcohol and does not use drugs.  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 E11.65     aspirin 81 MG tablet Take 81 mg by mouth daily.     calcium citrate-vitamin D (CITRACAL+D) 315-200 MG-UNIT tablet Take 1 tablet by mouth 2 (two) times daily.     Cholecalciferol (VITAMIN D3) 3000 units TABS Take by mouth.     famotidine (PEPCID) 20 MG tablet Take 20 mg by mouth as needed.      glucosamine-chondroitin 500-400 MG tablet Take 1 tablet by mouth 3 (three) times daily.     Insulin Disposable Pump (OMNIPOD DASH 5 PACK) MISC      lisinopril (ZESTRIL) 40 MG tablet Take by mouth.     metFORMIN (GLUCOPHAGE) 1000 MG tablet Take 1 tablet by mouth 2 (two) times daily.     Multiple Vitamin (MULTIVITAMIN) tablet Take 1 tablet by mouth daily.     Naltrexone-buPROPion HCl (CONTRAVE PO)      NEXLETOL 180 MG TABS Take 1 tablet by mouth daily.     NOVOLOG 100 UNIT/ML injection Inject into the skin.     ONE TOUCH ULTRA TEST test strip      OZEMPIC, 1 MG/DOSE, 4 MG/3ML SOPN      SODIUM FLUORIDE 5000 PPM 1.1 % GEL dental gel Take by mouth as directed.     tolterodine (DETROL LA) 4 MG 24 hr capsule Take 1 capsule (4 mg total) by mouth daily. 90 capsule 2   vitamin C (ASCORBIC ACID) 500 MG tablet Take 500 mg by mouth daily.     No current facility-administered medications for this visit.    Family History  Problem Relation Age of Onset   Thyroid disease Mother    Diabetes Father        Type 2    Heart attack Father 77   Heart failure Father    Prostate cancer Father    Rheumatic fever Brother    Colon cancer Maternal Aunt 3   Stomach cancer Neg Hx    Esophageal cancer Neg Hx    Rectal cancer Neg Hx     Review of Systems  Exam:   LMP 03/19/2007 (Approximate)     General appearance: alert, cooperative and appears stated age Head: normocephalic, without obvious abnormality, atraumatic Neck: no adenopathy, supple, symmetrical, trachea midline and thyroid normal to inspection and palpation Lungs: clear to auscultation bilaterally Breasts: normal appearance, no masses or tenderness, No nipple retraction or dimpling, No nipple discharge or bleeding, No axillary adenopathy Heart: regular rate and rhythm Abdomen: soft, non-tender; no masses, no organomegaly Extremities: extremities normal, atraumatic, no cyanosis or edema Skin: skin color, texture, turgor normal. No rashes or  lesions Lymph nodes: cervical, supraclavicular, and axillary nodes normal. Neurologic: grossly normal  Pelvic: External genitalia:  no lesions              No abnormal inguinal nodes palpated.              Urethra:  normal appearing urethra with no masses, tenderness or lesions              Bartholins and Skenes: normal                 Vagina: normal appearing vagina with normal color and discharge, no lesions              Cervix: no lesions              Pap taken: {yes no:314532} Bimanual Exam:  Uterus:  normal size, contour, position, consistency, mobility, non-tender              Adnexa: no mass, fullness, tenderness              Rectal exam: {yes no:314532}.  Confirms.              Anus:  normal sphincter tone, no lesions  Chaperone was present for exam:  ***  Assessment:   Well woman visit with gynecologic exam.   Plan: Mammogram screening discussed. Self breast awareness reviewed. Pap and HR HPV as above. Guidelines for Calcium, Vitamin D, regular exercise program including cardiovascular and weight bearing exercise.   Follow up annually and prn.   Additional counseling given.  {yes [id number]. _______ minutes face to face time of which over 50% was spent in counseling.    After visit summary provided.

## 2022-09-24 ENCOUNTER — Ambulatory Visit: Payer: 59 | Admitting: Obstetrics and Gynecology

## 2022-11-01 ENCOUNTER — Ambulatory Visit (INDEPENDENT_AMBULATORY_CARE_PROVIDER_SITE_OTHER): Payer: 59 | Admitting: Podiatrist

## 2022-11-01 DIAGNOSIS — L603 Nail dystrophy: Secondary | ICD-10-CM

## 2022-11-01 NOTE — Progress Notes (Signed)
Dr. Jarold Motto presents today for the 8th laser treatment. Diagnosed with mycotic nail infection by Dr. Eloy End.   Toenail most affected 1st bilateral.  All other systems are negative.  Nails were filed thin. Laser therapy was administered to 1-5 toenails bilateral and patient tolerated the treatment well. All safety precautions were in place.   She will follow up with Dr. Eloy End in 1 month, then if indicated, will schedule for 3 months for laser maintenance.

## 2022-12-03 ENCOUNTER — Ambulatory Visit: Payer: 59 | Admitting: Podiatry

## 2022-12-03 ENCOUNTER — Encounter: Payer: Self-pay | Admitting: Podiatry

## 2022-12-03 DIAGNOSIS — B351 Tinea unguium: Secondary | ICD-10-CM

## 2022-12-03 DIAGNOSIS — M79674 Pain in right toe(s): Secondary | ICD-10-CM

## 2022-12-03 DIAGNOSIS — Z794 Long term (current) use of insulin: Secondary | ICD-10-CM

## 2022-12-03 DIAGNOSIS — M79675 Pain in left toe(s): Secondary | ICD-10-CM

## 2022-12-03 DIAGNOSIS — E1165 Type 2 diabetes mellitus with hyperglycemia: Secondary | ICD-10-CM | POA: Diagnosis not present

## 2022-12-08 ENCOUNTER — Encounter: Payer: Self-pay | Admitting: Podiatry

## 2022-12-08 NOTE — Progress Notes (Signed)
Subjective:  Patient ID: Donna Krause, female    DOB: 07-24-1960,  MRN: [id number]  Donna Krause presents to clinic today for thick, elongated toenails of both great toes. She was treated by Dr. Jamse Arn on today's visit. She has been undergoing laser therapy for her toenails. Concern regarding bunion deformity left foot with great toe abutting 2nd digit. Chief Complaint  Patient presents with   Nail Problem    DFC BS - 142 A1C - 7.4     PCP is Dough, Doris Cheadle, MD.  Allergies  Allergen Reactions   Dapagliflozin Nausea And Vomiting and Other (See Comments)    dizziness    Review of Systems: Negative except as noted in the HPI.  Objective: No changes noted in today's physical examination. There were no vitals filed for this visit. Donna Krause is a pleasant 62 y.o. female in NAD. AAO x 3.  Vascular Examination: Vascular status intact b/l with palpable pedal pulses. Pedal hair present b/l. CFT immediate b/l. No edema. No pain with calf compression b/l. Skin temperature gradient WNL b/l.   Neurological Examination: Sensation grossly intact b/l with 10 gram monofilament. Vibratory sensation intact b/l. Pt has subjective symptoms of neuropathy.   Dermatological Examination: Pedal skin with normal turgor, texture and tone b/l. Improvement in lesser toenails.   Bilateral great toenails b/l thick, discolored, elongated with subungual debris. No hyperkeratotic lesions noted b/l.   Musculoskeletal Examination: Muscle strength 5/5 to b/l LE. HAV with bunion deformity noted b/l LE left >right. Hammertoe deformity noted 2-5 b/l.  Radiographs: None  Assessment/Plan: 1. Pain due to onychomycosis of toenails of both feet   2. Type 2 diabetes mellitus with hyperglycemia, with long-term current use of insulin (HCC)     She has had five laser treatments. Will reassess on next visit. -Patient to continue soft, supportive shoe gear daily. -Toenails bilateral great  toes debrided in length and girth without iatrogenic bleeding with sterile nail nipper and dremel.  -Dispensed toe spacer. Apply to 1st webspace left foot every morning. Remove every evening. -Patient/POA to call should there be question/concern in the interim.   Return in about 3 months (around 03/04/2023).  Freddie Breech, DPM

## 2022-12-31 NOTE — Progress Notes (Deleted)
62 y.o. G6P1011 Married Caucasian female here for annual exam.    PCP: Olive Bass, MD   Patient's last menstrual period was 03/19/2007 (approximate).           Sexually active: {yes no:314532}  The current method of family planning is post menopausal status.    Exercising: {yes no:314532}  {types:19826} Smoker:  no  OB History  Gravida Para Term Preterm AB Living  2 1 1   1 1   SAB IAB Ectopic Multiple Live Births               # Outcome Date GA Lbr Len/2nd Weight Sex Type Anes PTL Lv  2 Term 04/2000          1 AB              Health Maintenance: Pap:  06-20-20 Neg:Neg HR HPV, 04-21-17 Neg:Neg HR HPV,  03/07/15 Neg:Neg HR HPV  History of abnormal Pap:  yes, hx of LEEP in 1991 MMG: 09/03/22 Breast Density Cat A, BI-RADS CAT 1 neg Colonoscopy:   HM Colonoscopy          Colonoscopy (Every 10 Years) Next due on 08/23/2031    08/22/2021  COLONOSCOPY   Only the first 1 history entries have been loaded, but more history exists.           BMD:  09/13/19  Result  osteopenia  HIV: 08/07/06 NR Hep C: 08/07/06 neg  Immunization History  Administered Date(s) Administered   Fluzone Influenza virus vaccine,trivalent (IIV3), split virus 01/10/2014   Influenza Split 12/17/2018   Influenza,inj,Quad PF,6+ Mos 01/10/2014   Influenza-Unspecified 12/23/2016, 01/10/2018, 12/17/2018   PFIZER(Purple Top)SARS-COV-2 Vaccination 03/07/2019, 03/28/2019   Pneumococcal Polysaccharide-23 03/28/2011   Tdap 11/27/2012     {Labs (Optional):23779}   reports that she has never smoked. She has never used smokeless tobacco. She reports that she does not drink alcohol and does not use drugs.  Past Medical History:  Diagnosis Date   Abnormal Pap smear of cervix 1991   hx of LEEP procedure to cervix   Diabetes mellitus without complication (HCC)    Hyperlipidemia    Hypertension    Neuropathy    in feet   Osteopenia    Sleep apnea    wears cpap   STD (sexually transmitted disease)    hx.  HSV and condyloma/HPV   Urinary incontinence     Past Surgical History:  Procedure Laterality Date   BREAST REDUCTION SURGERY  03/18/1990   BREAST SURGERY     breast reduction   CERVICAL BIOPSY  W/ LOOP ELECTRODE EXCISION  03/18/1989   CERVICAL FUSION     CHOLECYSTECTOMY  03/18/2000   [place]OS[place]  2012   [place] in [place]   FOOT SURGERY  09/15/2012   right 5th metatarsal    Current Outpatient Medications  Medication Sig Dispense Refill   Accu-Chek FastClix Lancets MISC Use 6 times per day. E11.65     aspirin 81 MG tablet Take 81 mg by mouth daily.     calcium citrate-vitamin D (CITRACAL+D) 315-200 MG-UNIT tablet Take 1 tablet by mouth 2 (two) times daily.     Cholecalciferol (VITAMIN D3) 3000 units TABS Take by mouth.     famotidine (PEPCID) 20 MG tablet Take 20 mg by mouth as needed.     glucosamine-chondroitin 500-400 MG tablet Take 1 tablet by mouth 3 (three) times daily.     Insulin Disposable Pump (OMNIPOD DASH 5 PACK) MISC  lisinopril (ZESTRIL) 40 MG tablet Take by mouth.     metFORMIN (GLUCOPHAGE) 1000 MG tablet Take 1 tablet by mouth 2 (two) times daily.     Multiple Vitamin (MULTIVITAMIN) tablet Take 1 tablet by mouth daily.     Naltrexone-buPROPion HCl (CONTRAVE PO)      NEXLETOL 180 MG TABS Take 1 tablet by mouth daily.     NOVOLOG 100 UNIT/ML injection Inject into the skin.     ONE TOUCH ULTRA TEST test strip      OZEMPIC, 1 MG/DOSE, 4 MG/3ML SOPN      SODIUM FLUORIDE 5000 PPM 1.1 % GEL dental gel Take by mouth as directed.     tolterodine (DETROL LA) 4 MG 24 hr capsule Take 1 capsule (4 mg total) by mouth daily. 90 capsule 2   vitamin C (ASCORBIC ACID) 500 MG tablet Take 500 mg by mouth daily.     No current facility-administered medications for this visit.    Family History  Problem Relation Age of Onset   Thyroid disease Mother    Diabetes Father        Type 2    Heart attack Father 55   Heart failure Father    Prostate cancer Father    Rheumatic fever  Brother    Colon cancer Maternal Aunt 65   Stomach cancer Neg Hx    Esophageal cancer Neg Hx    Rectal cancer Neg Hx     Review of Systems  Exam:   LMP 03/19/2007 (Approximate)     General appearance: alert, cooperative and appears stated age Head: normocephalic, without obvious abnormality, atraumatic Neck: no adenopathy, supple, symmetrical, trachea midline and thyroid normal to inspection and palpation Lungs: clear to auscultation bilaterally Breasts: normal appearance, no masses or tenderness, No nipple retraction or dimpling, No nipple discharge or bleeding, No axillary adenopathy Heart: regular rate and rhythm Abdomen: soft, non-tender; no masses, no organomegaly Extremities: extremities normal, atraumatic, no cyanosis or edema Skin: skin color, texture, turgor normal. No rashes or lesions Lymph nodes: cervical, supraclavicular, and axillary nodes normal. Neurologic: grossly normal  Pelvic: External genitalia:  no lesions              No abnormal inguinal nodes palpated.              Urethra:  normal appearing urethra with no masses, tenderness or lesions              Bartholins and Skenes: normal                 Vagina: normal appearing vagina with normal color and discharge, no lesions              Cervix: no lesions              Pap taken: {yes no:314532} Bimanual Exam:  Uterus:  normal size, contour, position, consistency, mobility, non-tender              Adnexa: no mass, fullness, tenderness              Rectal exam: {yes no:314532}.  Confirms.              Anus:  normal sphincter tone, no lesions  Chaperone was present for exam:  {BSCHAPERONE:31226::"Terance Pomplun F, CMA"}   Assessment and Plan:   ***  Mammogram screening discussed. Self breast awareness reviewed. Guidelines for Calcium, Vitamin D, regular exercise program including cardiovascular and weight bearing exercise.   No follow-ups on  file.   Additional counseling given.  {yes [id number]. _______ minutes  face to face time of which over 50% was spent in counseling.    After visit summary provided.

## 2023-01-14 ENCOUNTER — Ambulatory Visit: Payer: 59 | Admitting: Obstetrics and Gynecology

## 2023-01-31 ENCOUNTER — Ambulatory Visit (INDEPENDENT_AMBULATORY_CARE_PROVIDER_SITE_OTHER): Payer: Self-pay | Admitting: *Deleted

## 2023-01-31 DIAGNOSIS — M79675 Pain in left toe(s): Secondary | ICD-10-CM

## 2023-01-31 DIAGNOSIS — B351 Tinea unguium: Secondary | ICD-10-CM

## 2023-01-31 DIAGNOSIS — M79674 Pain in right toe(s): Secondary | ICD-10-CM

## 2023-01-31 NOTE — Progress Notes (Signed)
Dr. Jarold Motto presents today for the laser maintenance. Diagnosed with mycotic nail infection by Dr. Eloy End.   Toenail most affected 1st bilateral.  All other systems are negative.  Nails were filed thin. Laser therapy was administered to 1-5 toenails bilateral and patient tolerated the treatment well. All safety precautions were in place.   Patient will follow up in 3 months for laser maintenance.

## 2023-02-07 ENCOUNTER — Other Ambulatory Visit: Payer: 59

## 2023-02-07 DIAGNOSIS — Z1231 Encounter for screening mammogram for malignant neoplasm of breast: Secondary | ICD-10-CM

## 2023-03-05 ENCOUNTER — Ambulatory Visit: Payer: 59 | Admitting: Podiatry

## 2023-04-15 NOTE — Progress Notes (Deleted)
 62 y.o. G20P1011 Married Caucasian female here for annual exam.    PCP: Olive Bass, MD   Patient's last menstrual period was 03/19/2007 (approximate).           Sexually active: Yes.    The current method of family planning is post menopausal status.    Menopausal hormone therapy:  n/a Exercising: {yes no:314532}  {types:19826} Smoker:  no  OB History  Gravida Para Term Preterm AB Living  2 1 1  1 1   SAB IAB Ectopic Multiple Live Births          # Outcome Date GA Lbr Len/2nd Weight Sex Type Anes PTL Lv  2 Term 04/2000          1 AB              HEALTH MAINTENANCE: Last 2 paps:  06/20/20 neg: HR HPV neg, 04/21/17 neg: HR HPV neg History of abnormal Pap or positive HPV:  yes, hx of LEEP in 1991 Mammogram:   09/03/22 Breast Density Cat A, BI-RADS CAT 1 neg Colonoscopy:  08/22/21 Bone Density:  12/25/21  Result  osteopenia   Immunization History  Administered Date(s) Administered   Fluzone Influenza virus vaccine,trivalent (IIV3), split virus 01/10/2014   Influenza Split 12/17/2018   Influenza,inj,Quad PF,6+ Mos 01/10/2014   Influenza-Unspecified 12/23/2016, 01/10/2018, 12/17/2018   PFIZER(Purple Top)SARS-COV-2 Vaccination 03/07/2019, 03/28/2019   Pneumococcal Polysaccharide-23 03/28/2011   Tdap 11/27/2012      reports that she has never smoked. She has never used smokeless tobacco. She reports that she does not drink alcohol and does not use drugs.  Past Medical History:  Diagnosis Date   Abnormal Pap smear of cervix 1991   hx of LEEP procedure to cervix   Diabetes mellitus without complication (HCC)    Hyperlipidemia    Hypertension    Neuropathy    in feet   Osteopenia    Sleep apnea    wears cpap   STD (sexually transmitted disease)    hx. HSV and condyloma/HPV   Urinary incontinence     Past Surgical History:  Procedure Laterality Date   BREAST REDUCTION SURGERY  03/18/1990   BREAST SURGERY     breast reduction   CERVICAL BIOPSY  W/ LOOP ELECTRODE  EXCISION  03/18/1989   CERVICAL FUSION     CHOLECYSTECTOMY  03/18/2000   COL[place]OSCOPY  2012   Normal in [place]   FOOT SURGERY  09/15/2012   right 5th metatarsal    Current Outpatient Medications  Medication Sig Dispense Refill   Accu-Chek FastClix Lancets MISC Use 6 times per day. E11.65     aspirin 81 MG tablet Take 81 mg by mouth daily.     calcium citrate-vitamin D (CITRACAL+D) 315-200 MG-UNIT tablet Take 1 tablet by mouth 2 (two) times daily.     Cholecalciferol (VITAMIN D3) 3000 units TABS Take by mouth.     famotidine (PEPCID) 20 MG tablet Take 20 mg by mouth as needed.     glucosamine-chondroitin 500-400 MG tablet Take 1 tablet by mouth 3 (three) times daily.     Insulin Disposable Pump (OMNIPOD DASH 5 PACK) MISC      lisinopril (ZESTRIL) 40 MG tablet Take by mouth.     metFORMIN (GLUCOPHAGE) 1000 MG tablet Take 1 tablet by mouth 2 (two) times daily.     Multiple Vitamin (MULTIVITAMIN) tablet Take 1 tablet by mouth daily.     Naltrexone-buPROPion HCl (CONTRAVE PO)      NEXLETOL  180 MG TABS Take 1 tablet by mouth daily.     NOVOLOG 100 UNIT/ML injection Inject into the skin.     ONE TOUCH ULTRA TEST test strip      OZEMPIC, 1 MG/DOSE, 4 MG/3ML SOPN      SODIUM FLUORIDE 5000 PPM 1.1 % GEL dental gel Take by mouth as directed.     tolterodine (DETROL LA) 4 MG 24 hr capsule Take 1 capsule (4 mg total) by mouth daily. 90 capsule 2   vitamin C (ASCORBIC ACID) 500 MG tablet Take 500 mg by mouth daily.     No current facility-administered medications for this visit.    ALLERGIES: Dapagliflozin  Family History  Problem Relation Age of Onset   Thyroid disease Mother    Diabetes Father        Type 2    Heart attack Father 68   Heart failure Father    Prostate cancer Father    Rheumatic fever Brother    Colon cancer Maternal Aunt 41   Stomach cancer Neg Hx    Esophageal cancer Neg Hx    Rectal cancer Neg Hx     Review of Systems  PHYSICAL EXAM:  LMP 03/19/2007 (Approximate)      General appearance: alert, cooperative and appears stated age Head: normocephalic, without obvious abnormality, atraumatic Neck: no adenopathy, supple, symmetrical, trachea midline and thyroid normal to inspection and palpation Lungs: clear to auscultation bilaterally Breasts: normal appearance, no masses or tenderness, No nipple retraction or dimpling, No nipple discharge or bleeding, No axillary adenopathy Heart: regular rate and rhythm Abdomen: soft, non-tender; no masses, no organomegaly Extremities: extremities normal, atraumatic, no cyanosis or edema Skin: skin color, texture, turgor normal. No rashes or lesions Lymph nodes: cervical, supraclavicular, and axillary nodes normal. Neurologic: grossly normal  Pelvic: External genitalia:  no lesions              No abnormal inguinal nodes palpated.              Urethra:  normal appearing urethra with no masses, tenderness or lesions              Bartholins and Skenes: normal                 Vagina: normal appearing vagina with normal color and discharge, no lesions              Cervix: no lesions              Pap taken: {yes no:314532} Bimanual Exam:  Uterus:  normal size, contour, position, consistency, mobility, non-tender              Adnexa: no mass, fullness, tenderness              Rectal exam: {yes no:314532}.  Confirms.              Anus:  normal sphincter tone, no lesions  Chaperone was present for exam:  {BSCHAPERONE:31226::"Xiomar Crompton F, CMA"}  ASSESSMENT: Well woman visit with gynecologic exam  ***  PLAN: Mammogram screening discussed. Self breast awareness reviewed. Pap and HRV collected:  {yes no:314532} Guidelines for Calcium, Vitamin D, regular exercise program including cardiovascular and weight bearing exercise. Medication refills:  *** {LABS (Optional):23779} Follow up:  ***    Additional counseling given.  {yes [id number]. ***  total time was spent for this patient encounter, including preparation,  face-to-face counseling with the patient, coordination of care, and documentation of the encounter in  addition to doing the well woman visit with gynecologic exam.

## 2023-04-17 ENCOUNTER — Ambulatory Visit: Payer: 59 | Admitting: Podiatry

## 2023-04-29 ENCOUNTER — Ambulatory Visit: Payer: 59 | Admitting: Obstetrics and Gynecology

## 2023-05-06 ENCOUNTER — Ambulatory Visit: Payer: 59 | Admitting: Podiatry

## 2023-05-07 ENCOUNTER — Ambulatory Visit: Payer: 59 | Admitting: Podiatry

## 2023-05-08 ENCOUNTER — Ambulatory Visit: Payer: 59 | Admitting: Podiatry

## 2023-05-09 ENCOUNTER — Ambulatory Visit (INDEPENDENT_AMBULATORY_CARE_PROVIDER_SITE_OTHER): Payer: 59 | Admitting: Podiatrist

## 2023-05-09 DIAGNOSIS — L603 Nail dystrophy: Secondary | ICD-10-CM

## 2023-05-09 NOTE — Progress Notes (Signed)
 Dr. Jarold Motto presents today for the laser maintenance. Diagnosed with mycotic nail infection by Dr. Eloy End.   Toenail most affected 1st bilateral.  Briefly discussed the left second toe-  it is swollen at the pip joint today.  She relates she has had the hammertoe and bunion for a long time.  Relates no pain, just swelling and redness.  All other systems are negative.  Toe was evaluated and noted to be semi rigid in nature. No calor, slight pink discoloration.  Recommended she keep her scheduled appt with Dr. Carlota Raspberry next week to discuss the hammertoe-  informed her a steroid injection may be helpful or ultimately surgery to correct the hammertoe.    Toe nails were trimmed back and  filed thin. Laser therapy was administered to 1-5 toenails bilateral and patient tolerated the treatment well. All safety precautions were in place.   Patient will follow up in 3 months for laser maintenance.

## 2023-05-13 ENCOUNTER — Ambulatory Visit: Payer: 59 | Admitting: Podiatry

## 2023-05-14 ENCOUNTER — Ambulatory Visit (INDEPENDENT_AMBULATORY_CARE_PROVIDER_SITE_OTHER): Payer: 59

## 2023-05-14 ENCOUNTER — Ambulatory Visit (INDEPENDENT_AMBULATORY_CARE_PROVIDER_SITE_OTHER): Payer: 59 | Admitting: Podiatry

## 2023-05-14 DIAGNOSIS — M2012 Hallux valgus (acquired), left foot: Secondary | ICD-10-CM

## 2023-05-14 DIAGNOSIS — R600 Localized edema: Secondary | ICD-10-CM | POA: Diagnosis not present

## 2023-05-14 DIAGNOSIS — M21612 Bunion of left foot: Secondary | ICD-10-CM

## 2023-05-14 DIAGNOSIS — M2042 Other hammer toe(s) (acquired), left foot: Secondary | ICD-10-CM

## 2023-05-14 DIAGNOSIS — B351 Tinea unguium: Secondary | ICD-10-CM | POA: Diagnosis not present

## 2023-05-14 DIAGNOSIS — M25572 Pain in left ankle and joints of left foot: Secondary | ICD-10-CM

## 2023-05-14 MED ORDER — CICLOPIROX 8 % EX SOLN: Freq: Every day | CUTANEOUS | 11 refills | Status: AC

## 2023-05-14 NOTE — Progress Notes (Signed)
 Chief Complaint  Patient presents with   Hammer Toe    Left 2nd toe, has hammertoe with swelling. Some discoloration. Last A1c 7.42m two months ago and she takes asa 66.  Toe has been swollen for 3 weeks.    HPI: 63 y.o. diabetic female presents today with c/o pain and swelling in the left 2nd toe.  Denies injury.  Also has secondary c/o fungus in the toenails.  She had been doing laser treatments.   Past Medical History:  Diagnosis Date   Abnormal Pap smear of cervix 1991   hx of LEEP procedure to cervix   Diabetes mellitus without complication (HCC)    Hyperlipidemia    Hypertension    Neuropathy    in feet   Osteopenia    Sleep apnea    wears cpap   STD (sexually transmitted disease)    hx. HSV and condyloma/HPV   Urinary incontinence     Past Surgical History:  Procedure Laterality Date   BREAST REDUCTION SURGERY  03/18/1990   BREAST SURGERY     breast reduction   CERVICAL BIOPSY  W/ LOOP ELECTRODE EXCISION  03/18/1989   CERVICAL FUSION     CHOLECYSTECTOMY  03/18/2000   [place]OS[place]  2012   [place] in [place]   FOOT SURGERY  09/15/2012   right 5th metatarsal    Allergies  Allergen Reactions   Dapagliflozin Nausea And Vomiting and Other (See Comments)    dizziness    Review of Systems  Musculoskeletal:  Positive for joint pain.    Physical Exam: General: The patient is alert and oriented x3 in no acute distress.  Dermatology: Skin is warm, dry and supple bilateral lower extremities. Interspaces are clear of maceration and debris.  Nails with discoloration, distal onycholysis, and increased thickness.   Vascular: Palpable pedal pulses bilaterally. Capillary refill within normal limits.    Neurological: Light touch sensation grossly intact bilateral feet.   Musculoskeletal Exam: Contracture of left 2nd toe at PIPJ level with localized edema and POP of joint.  No erythema.  This is not fully reducible with manipulation.  Lateral angulation of hallux at 1st MPJ  with bony prominence on medial 1st met head.  Collapsed midfoot.    Radiographic Exam (left foot, 3 WB views):  Normal osseous mineralization. Long first metatarsal.  Increased 1st IM angle (~13 degrees) and hallux abductus angle.  Tibial sesamoid position is 5.  Slight medial angulation of 2nd toe at MPJ level with contracture at PIPJ and lateral angulation of middle phalanx at PIPJ level.  No fracture seen.  Severe flattening of the arch on lateral view with decreased calcaneal inclination angle.   Assessment/Plan of Care: 1. Hammer toe of left foot   2. Hallux valgus with bunions of left foot   3. Localized edema   4. Dermatophytosis of nail      Meds ordered this encounter  Medications   ciclopirox (PENLAC) 8 % solution    Sig: Apply topically at bedtime. Apply over nail and surrounding skin. Apply daily over previous coat. Remove weekly with polish remover.    Dispense:  6.6 mL    Refill:  11   Discussed clinical & radiographic findings with patient today.  Rx ciclopirox sent to patient's pharmacy for nail fungus.  Apply thin coat to affected nails daily, and remove once weekly with nail polish remover.    With the patient's verbal consent, a corticosteroid injection was adminstered to the left 2nd toe PIPJ.  This consisted of a mixture of 1% lidocaine plain, 0.5% sensorcaine plain, and Kenalog-10 for a total of 1cc administered.  Bandaid applied. Patient tolerated this well.     She was fitted for a toe straightening device to wear.   F/u one week for recheck and surgical planning regarding correction of left bunion and 2nd hammertoe.  Clerance Lav, DPM, FACFAS [facility]     [address] [place], [place] [address]                Office [phone]  Fax [phone]

## 2023-06-06 ENCOUNTER — Ambulatory Visit: Payer: 59 | Admitting: Podiatry

## 2023-06-12 ENCOUNTER — Ambulatory Visit: Admitting: Podiatry

## 2023-06-12 ENCOUNTER — Ambulatory Visit (INDEPENDENT_AMBULATORY_CARE_PROVIDER_SITE_OTHER)

## 2023-06-12 DIAGNOSIS — M2012 Hallux valgus (acquired), left foot: Secondary | ICD-10-CM

## 2023-06-12 DIAGNOSIS — M2042 Other hammer toe(s) (acquired), left foot: Secondary | ICD-10-CM

## 2023-06-12 DIAGNOSIS — M21612 Bunion of left foot: Secondary | ICD-10-CM

## 2023-06-12 NOTE — Progress Notes (Signed)
 Chief Complaint  Patient presents with   Bunions    SX consult today, new Ap view is up. Last A1c 7.1 in Jan.    HPI: 63 y.o. diabetic female presents today for surgical consult.  She was seen previously for the same issue and her main concern is the painful left second hammertoe.  She is aware that she has hallux valgus with bunion deformity and this will need to be corrected so as to create room to bring the second toe into a more rectus position.  She notes that she has trip planned around July/August and wants to make sure the area would be completely healed before this trip or if she should wait until after she returns to have the surgery performed.  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 HSV and condyloma/HPV   Urinary incontinence     Past Surgical History:  Procedure Laterality Date   BREAST REDUCTION SURGERY  03/18/1990   BREAST SURGERY     breast reduction   CERVICAL BIOPSY  W/ LOOP ELECTRODE EXCISION  03/18/1989   CERVICAL FUSION     CHOLECYSTECTOMY  03/18/2000   [place]OS[place]  2012   [place] in [place]   FOOT SURGERY  09/15/2012   right 5th metatarsal    Allergies  Allergen Reactions   Dapagliflozin Nausea And Vomiting and Other (See Comments)    dizziness    Physical Exam: There are palpable pedal pulses noted.  There is contracture of the left second toe at the PIP joint.  There is lateral angulation of the hallux which is manually reducible with manipulation.  There is a bony prominence on the medial first met head.  There is mild decrease in range of motion of the first MPJ.  Epicritic sensation is  intact  Radiographic Exam:  Normal osseous mineralization.  There is dorsal contracture of the second toe at the PIPJ as well as medial angulation of the second proximal phalanx at the level of the MPJ.  There is mild uneven joint space narrowing at the first MPJ.  First IM angle is 11.71.  Tibial sesamoid position is 5.  No fractures noted  Assessment/Plan of Care: 1. Hammer toe of left foot   2. Hallux valgus with bunions of left foot     Discussed clinical and radiographic findings with patient today.  Patient would benefit from correction of the bunion which would involve an osteotomy of the first metatarsal and first proximal phalanx (Austin/Akin).  This will provide straightening of the first ray so that the second toe can also be straightened.  Due to the deformity of the second hammertoe in both the sagittal and transverse planes, she will need to have arthrodesis at the PIP joint as well as soft tissue release at the second MPJ with temporary K wire percutaneous fixation.  She will be in a surgical shoe for 4 to 6 weeks with limited weightbearing.  She may use a walker or knee scooter during the postoperative period.  She cannot be full weightbearing  or only use a cane during the first 4 weeks postop.  She will need to elevate and ice the foot several times throughout the day during her postoperative.  Risk, benefits, and possible postoperative complications were discussed with the patient today.  We reviewed and signed the consent form today.  Recommended performing the procedure after she returns from her trip in mid August.  If she has any FMLA paperwork we can complete this on her behalf, however patient stated that she is now retired.  Discussed this procedure being performed under intravenous sedation with local anesthetic block to the foot.  She can discuss her medications with her PCP around the perioperative period including which medications to avoid prior to surgery and on the day  of surgery.  Will have our surgery scheduler look at mid to late August for scheduling this patient for left bunion and second hammertoe correction   Donna Krause DBurna Mortimer, DPM, FACFAS [facility]     [address] [place], [place] [address]                Office [phone]  Fax [phone]

## 2023-06-16 ENCOUNTER — Telehealth: Payer: Self-pay | Admitting: Urology

## 2023-06-16 NOTE — Telephone Encounter (Signed)
 LM for pt to call back to schedule surgery with Dr. [name]

## 2023-07-23 NOTE — Progress Notes (Unsigned)
 63 y.o. G40P1011 Married Caucasian female here for annual exam.    Up several times a night to void.  Takes Detrol  LA occasionally.  Not so helpful.  Myrbetriq  was a non-covered medication in April, 2023.  See Epic.  Did pelvic floor therapy.  Would like to try Gemtesa.   Retired in April. Spouse is also retired.  Son lives with them.  They will travel to [place].  Goes to the gym.   PCP: Laurence Pons, MD   Patient's last menstrual period was 03/19/2007 (approximate).           Sexually active: Yes.    The current method of family planning is post menopausal status.    Menopausal hormone therapy:  n/a Exercising: Yes.    [employer], yoga, pilates, weight lifting  Smoker:  no  OB History  Gravida Para Term Preterm AB Living  2 1 1  1 1   SAB IAB Ectopic Multiple Live Births          # Outcome Date GA Lbr Len/2nd Weight Sex Type Anes PTL Lv  2 Term 04/2000          1 AB              HEALTH MAINTENANCE: Last 2 paps:  06/20/20 neg HR HPV neg, 04/21/17 neg HPV neg History of abnormal Pap or positive HPV:  no Mammogram:   09/03/22 Breast Density Cat A, BIRADS Cat 1 neg  Colonoscopy:  08/22/21 - due in 2030 Bone Density:  12/25/21  Result  osteopenia    Immunization History  Administered Date(s) Administered   Fluzone Influenza virus vaccine,trivalent (IIV3), split virus 01/10/2014   Influenza Split 12/17/2018   Influenza,inj,Quad PF,6+ Mos 01/10/2014   Influenza-Unspecified 12/23/2016, 01/10/2018, 12/17/2018   PFIZER(Purple Top)SARS-COV-2 Vaccination 03/07/2019, 03/28/2019   Pneumococcal Polysaccharide-23 03/28/2011   Tdap 11/27/2012      reports that she has never smoked. She has never used smokeless tobacco. She reports that she does not drink alcohol and does not use drugs.  Past Medical History:  Diagnosis Date   Abnormal Pap smear of cervix 1991   hx of LEEP procedure to cervix   Diabetes mellitus without complication (HCC)    Hyperlipidemia    Hypertension     Neuropathy    in feet   Osteopenia    Sleep apnea    wears cpap   STD (sexually transmitted disease)    hx. HSV and condyloma/HPV   Urinary incontinence     Past Surgical History:  Procedure Laterality Date   BREAST REDUCTION SURGERY  03/18/1990   BREAST SURGERY     breast reduction   CERVICAL BIOPSY  W/ LOOP ELECTRODE EXCISION  03/18/1989   CERVICAL FUSION     CHOLECYSTECTOMY  03/18/2000   COLONOSCOPY  2012   [place] in [place]   FOOT SURGERY  09/15/2012   right 5th metatarsal    Current Outpatient Medications  Medication Sig Dispense Refill   Accu-Chek FastClix Lancets MISC Use 6 times per day. E11.65     aspirin 81 MG tablet Take 81 mg by mouth daily.     Cholecalciferol (VITAMIN D3) 3000 units TABS Take by mouth.     ciclopirox  (PENLAC ) 8 % solution Apply topically at bedtime. Apply over nail and surrounding skin. Apply daily over previous coat. Remove weekly with polish remover. 6.6 mL 11   famotidine (PEPCID) 20 MG tablet Take 20 mg by mouth as needed.     glucosamine-chondroitin 500-400 MG  tablet Take 1 tablet by mouth 3 (three) times daily.     Insulin Disposable Pump (OMNIPOD DASH 5 PACK) MISC      lisinopril (ZESTRIL) 40 MG tablet Take by mouth.     metFORMIN (GLUCOPHAGE) 1000 MG tablet Take 1 tablet by mouth 2 (two) times daily.     MOUNJARO 10 MG/0.5ML Pen      Multiple Vitamin (MULTIVITAMIN) tablet Take 1 tablet by mouth daily.     Naltrexone-buPROPion HCl (CONTRAVE PO)      NEXLETOL 180 MG TABS Take 1 tablet by mouth daily.     ONE TOUCH ULTRA TEST test strip      SODIUM FLUORIDE 5000 PPM 1.1 % GEL dental gel Take by mouth as directed.     tolterodine  (DETROL  LA) 4 MG 24 hr capsule Take 1 capsule (4 mg total) by mouth daily. 90 capsule 2   vitamin C (ASCORBIC ACID) 500 MG tablet Take 500 mg by mouth daily.     calcium citrate-vitamin D (CITRACAL+D) 315-200 MG-UNIT tablet Take 1 tablet by mouth 2 (two) times daily. (Patient not taking: Reported on 07/24/2023)      NOVOLOG 100 UNIT/ML injection Inject into the skin. (Patient not taking: Reported on 07/24/2023)     No current facility-administered medications for this visit.    ALLERGIES: Dapagliflozin  Family History  Problem Relation Age of Onset   Thyroid disease Mother    Diabetes Father        Type 2    Heart attack Father 83   Heart failure Father    Prostate cancer Father    Rheumatic fever Brother    Colon cancer Maternal Aunt 43   Stomach cancer Neg Hx    Esophageal cancer Neg Hx    Rectal cancer Neg Hx     Review of Systems  All other systems reviewed and are negative.   PHYSICAL EXAM:  BP 124/70 (BP Location: Left Arm, Patient Position: Sitting)   Pulse 90   Ht 5\' 6"  (1.676 m)   Wt 210 lb (95.3 kg)   LMP 03/19/2007 (Approximate)   SpO2 98%   BMI 33.89 kg/m     General appearance: alert, cooperative and appears stated age Head: normocephalic, without obvious abnormality, atraumatic Neck: no adenopathy, supple, symmetrical, trachea midline and thyroid normal to inspection and palpation Lungs: clear to auscultation bilaterally Breasts: consistent with bilateral reduction, no masses or tenderness, No nipple retraction or dimpling, No nipple discharge or bleeding, No axillary adenopathy Heart: regular rate and rhythm Abdomen: soft, non-tender; no masses, no organomegaly Extremities: extremities normal, atraumatic, no cyanosis or edema Skin: skin color, texture, turgor normal. No rashes or lesions Lymph nodes: cervical, supraclavicular, and axillary nodes normal. Neurologic: grossly normal  Pelvic: External genitalia:  no lesions              No abnormal inguinal nodes palpated.              Urethra:  normal appearing urethra with no masses, tenderness or lesions              Bartholins and Skenes: normal                 Vagina: normal appearing vagina with normal color and discharge, no lesions              Cervix: no lesions              Pap taken: no Bimanual Exam:   Uterus:  normal size, contour, position, consistency, mobility, non-tender              Adnexa: no mass, fullness, tenderness              Rectal exam: yes.  Confirms.              Anus:  normal sphincter tone, no lesions  Chaperone was present for exam:  Cottie Diss, CMA  ASSESSMENT: Well woman visit with gynecologic exam. Hx bilateral breast reduction.  Hx mixed incontinence.  Overactive bladder symptoms.  History of LEEP and condyloma. Osteopenia and history of stress fracture. DM.  PHQ-2: 0  PLAN: Mammogram screening discussed. Self breast awareness reviewed. Pap and HRV collected:  no.  Due in 2027.  Guidelines for Calcium, Vitamin D, regular exercise program including cardiovascular and weight bearing exercise. Medication:  Gemtesa 75 mg daily.  #30, RF 11.   She will let me know how she is doing with the medication.  Labs with PCP.  [facility]  Follow up:  yearly and prn.

## 2023-07-24 ENCOUNTER — Ambulatory Visit (INDEPENDENT_AMBULATORY_CARE_PROVIDER_SITE_OTHER): Payer: 59 | Admitting: Obstetrics and Gynecology

## 2023-07-24 ENCOUNTER — Encounter: Payer: Self-pay | Admitting: Obstetrics and Gynecology

## 2023-07-24 VITALS — BP 124/70 | HR 90 | Ht 66.0 in | Wt 210.0 lb

## 2023-07-24 DIAGNOSIS — Z01419 Encounter for gynecological examination (general) (routine) without abnormal findings: Secondary | ICD-10-CM | POA: Diagnosis not present

## 2023-07-24 DIAGNOSIS — M8580 Other specified disorders of bone density and structure, unspecified site: Secondary | ICD-10-CM | POA: Diagnosis not present

## 2023-07-24 DIAGNOSIS — N3281 Overactive bladder: Secondary | ICD-10-CM

## 2023-07-24 DIAGNOSIS — Z1331 Encounter for screening for depression: Secondary | ICD-10-CM

## 2023-07-24 MED ORDER — GEMTESA 75 MG PO TABS: 75.0000 mg | ORAL_TABLET | Freq: Every day | ORAL | 11 refills | Status: DC

## 2023-07-24 NOTE — Patient Instructions (Addendum)
Vibegron Tablets What is this medication? VIBEGRON (vye BEG ron) treats symptoms of an overactive bladder, such as loss of bladder control or frequent need to urinate. It works by relaxing muscles in the bladder. It belongs to a group of medications called antispasmodics. This medicine may be used for other purposes; ask your health care provider or pharmacist if you have questions. COMMON BRAND NAME(S): GEMTESA What should I tell my care team before I take this medication? They need to know if you have any of these conditions: Kidney disease Liver disease Prostate disease Trouble passing urine An unusual or allergic reaction to vibegron, other medications, foods, dyes, or preservatives Pregnant or trying to get pregnant Breast-feeding How should I use this medication? Take this medication by mouth with water. Take it as directed on the prescription label at the same time every day. Swallow the tablets whole. You may crush the tablet and mix with 1 tablespoon (15 mL) of applesauce. Swallow the medication and applesauce right away. Keep taking it unless your care team tells you to stop. You can take it with or without food. If it upsets your stomach, take it with food. Talk to your care team about the use of this medication in children. Special care may be needed. Overdosage: If you think you have taken too much of this medicine contact a poison control center or emergency room at once. NOTE: This medicine is only for you. Do not share this medicine with others. What if I miss a dose? If you miss a dose, take it as soon as you can. If it is almost time for your next dose, take only that dose. Do not take double or extra doses. What may interact with this medication? This medication may interact with the following: Digoxin This list may not describe all possible interactions. Give your health care provider a list of all the medicines, herbs, non-prescription drugs, or dietary supplements you  use. Also tell them if you smoke, drink alcohol, or use illegal drugs. Some items may interact with your medicine. What should I watch for while using this medication? Visit your care team for regular checks on your progress. Tell your care team if your symptoms do not start to get better or if they get worse. You may need to limit your intake of tea, coffee, caffeinated drinks, or alcohol. These drinks may make your symptoms worse. What side effects may I notice from receiving this medication? Side effects that you should report to your care team as soon as possible: Allergic reactions--skin rash, itching, hives, swelling of the face, lips, tongue, or throat Trouble passing urine Side effects that usually do not require medical attention (report to your care team if they continue or are bothersome): Diarrhea Headache Nausea Runny or stuffy nose Sore throat This list may not describe all possible side effects. Call your doctor for medical advice about side effects. You may report side effects to FDA at 1-800-FDA-1088. Where should I keep my medication? Keep out of the reach of children and pets. Store at room temperature between 20 and 25 degrees C (68 and 77 degrees F). Get rid of any unused medication after the expiration date. To get rid of medications that are no longer needed or have expired: Take the medication to a medication take-back program. Check with your pharmacy or law enforcement to find a location. If you cannot return the medication, check the label or package insert to see if the medication should be thrown out in  the garbage or flushed down the toilet. If you are not sure, ask your care team. If it is safe to put it in the trash, empty the medication out of the container. Mix the medication with cat litter, dirt, coffee grounds, or other unwanted substance. Seal the mixture in a bag or container. Put it in the trash. NOTE: This sheet is a summary. It may not cover all possible  information. If you have questions about this medicine, talk to your doctor, pharmacist, or health care provider.  2024 Elsevier/Gold Standard (2021-03-26 00:00:00)   EXERCISE AND DIET:  We recommended that you start or continue a regular exercise program for good health. Regular exercise means any activity that makes your heart beat faster and makes you sweat.  We recommend exercising at least 30 minutes per day at least 3 days a week, preferably 4 or 5.  We also recommend a diet low in fat and sugar.  Inactivity, poor dietary choices and obesity can cause diabetes, heart attack, stroke, and kidney damage, among others.    ALCOHOL AND SMOKING:  Women should limit their alcohol intake to no more than 7 drinks/beers/glasses of wine (combined, not each!) per week. Moderation of alcohol intake to this level decreases your risk of breast cancer and liver damage. And of course, no recreational drugs are part of a healthy lifestyle.  And absolutely no smoking or even second hand smoke. Most people know smoking can cause heart and lung diseases, but did you know it also contributes to weakening of your bones? Aging of your skin?  Yellowing of your teeth and nails?  CALCIUM AND VITAMIN D:  Adequate intake of calcium and Vitamin D are recommended.  The recommendations for exact amounts of these supplements seem to change often, but generally speaking 600 mg of calcium (either carbonate or citrate) and 800 units of Vitamin D per day seems prudent. Certain women may benefit from higher intake of Vitamin D.  If you are among these women, your doctor will have told you during your visit.    PAP SMEARS:  Pap smears, to check for cervical cancer or precancers,  have traditionally been done yearly, although recent scientific advances have shown that most women can have pap smears less often.  However, every woman still should have a physical exam from her gynecologist every year. It will include a breast check,  inspection of the vulva and vagina to check for abnormal growths or skin changes, a visual exam of the cervix, and then an exam to evaluate the size and shape of the uterus and ovaries.  And after 66 years of age, a rectal exam is indicated to check for rectal cancers. We will also provide age appropriate advice regarding health maintenance, like when you should have certain vaccines, screening for sexually transmitted diseases, bone density testing, colonoscopy, mammograms, etc.   MAMMOGRAMS:  All women over 44 years old should have a yearly mammogram. Many facilities now offer a "3D" mammogram, which may cost around $50 extra out of pocket. If possible,  we recommend you accept the option to have the 3D mammogram performed.  It both reduces the number of women who will be called back for extra views which then turn out to be normal, and it is better than the routine mammogram at detecting truly abnormal areas.    COLONOSCOPY:  Colonoscopy to screen for colon cancer is recommended for all women at age 96.  We know, you hate the idea of the prep.  We agree, BUT, having colon cancer and not knowing it is worse!!  Colon cancer so often starts as a polyp that can be seen and removed at colonscopy, which can quite literally save your life!  And if your first colonoscopy is normal and you have no family history of colon cancer, most women don't have to have it again for 10 years.  Once every ten years, you can do something that may end up saving your life, right?  We will be happy to help you get it scheduled when you are ready.  Be sure to check your insurance coverage so you understand how much it will cost.  It may be covered as a preventative service at no cost, but you should check your particular policy.

## 2023-08-08 ENCOUNTER — Ambulatory Visit (INDEPENDENT_AMBULATORY_CARE_PROVIDER_SITE_OTHER): Payer: 59

## 2023-08-08 DIAGNOSIS — L603 Nail dystrophy: Secondary | ICD-10-CM

## 2023-08-08 NOTE — Progress Notes (Signed)
 Patient presents today for the maintenance laser treatment. Diagnosed with mycotic nail infection by Dr. Denece Finger.   Toenail most affected 1st bilateral.  All other systems are negative. Nails are improving and growing out.   Nails were filed thin. Laser therapy was administered to 1-5 toenails bilaterally and patient tolerated the treatment well. All safety precautions were in place.    Follow up in 3 months for laser maintenance.

## 2023-08-25 ENCOUNTER — Other Ambulatory Visit: Payer: Self-pay | Admitting: Internal Medicine

## 2023-08-25 DIAGNOSIS — Z1231 Encounter for screening mammogram for malignant neoplasm of breast: Secondary | ICD-10-CM

## 2023-09-12 ENCOUNTER — Ambulatory Visit
Admission: RE | Admit: 2023-09-12 | Discharge: 2023-09-12 | Disposition: A | Discharge status: Home / Self Care | Source: Ambulatory Visit | Attending: Internal Medicine | Admitting: Internal Medicine

## 2023-09-12 DIAGNOSIS — Z1231 Encounter for screening mammogram for malignant neoplasm of breast: Secondary | ICD-10-CM

## 2023-09-23 IMAGING — MG MM DIGITAL SCREENING BILAT W/ TOMO AND CAD
8 series · 8 of 24 positions shown · non-contrast
Comparison: Previous exam(s).

CLINICAL DATA: Screening.

EXAM:
DIGITAL SCREENING BILATERAL MAMMOGRAM WITH TOMOSYNTHESIS AND CAD
TECHNIQUE: Bilateral screening digital craniocaudal and mediolateral oblique
mammograms were obtained. Bilateral screening digital breast
tomosynthesis was performed. The images were evaluated with
computer-aided detection.

[L CC synth-2D]
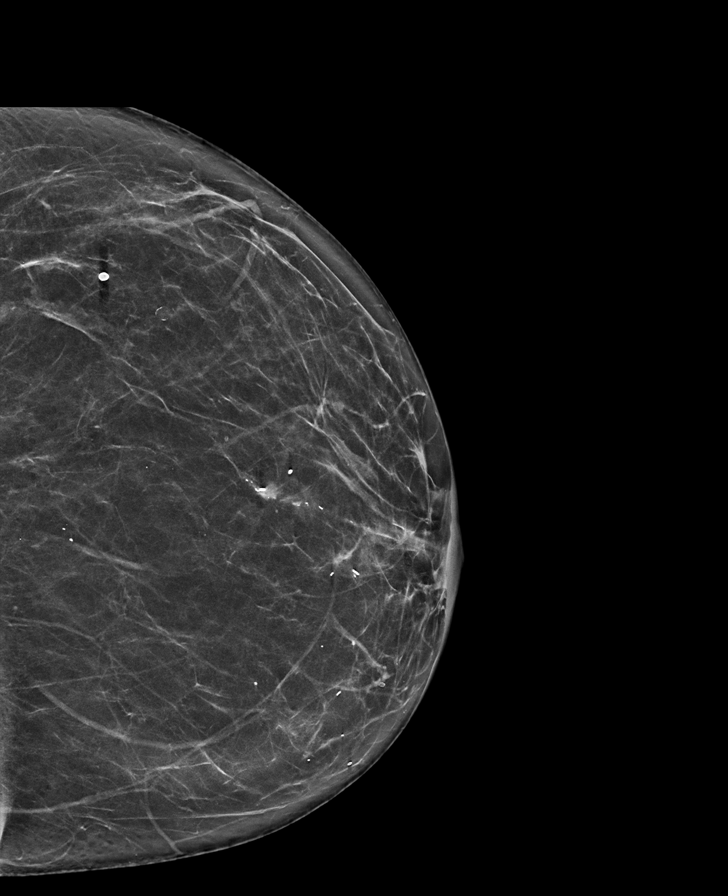

[R CC synth-2D]
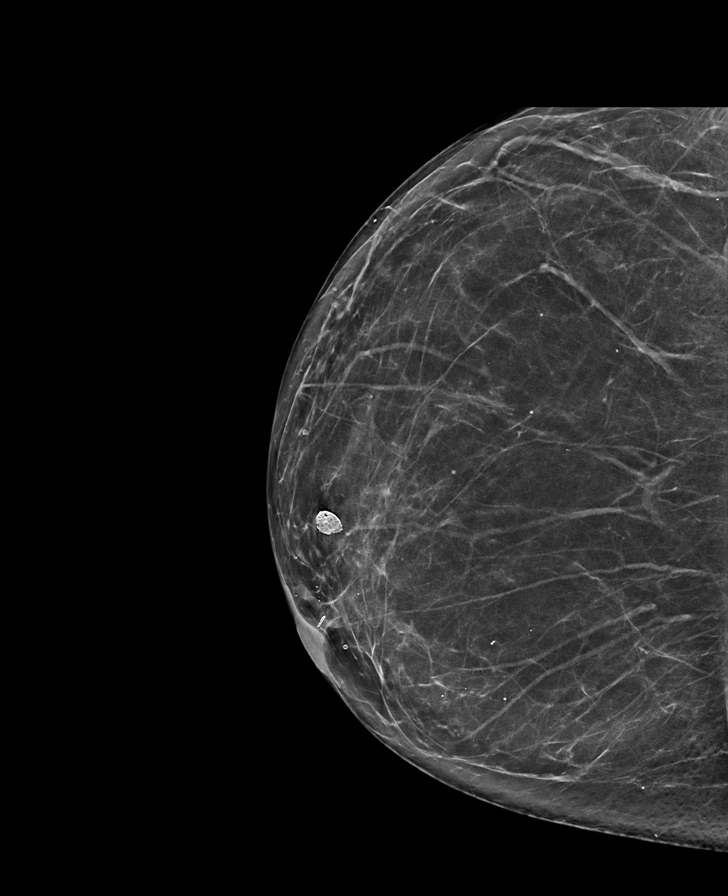

[L MLO synth-2D]
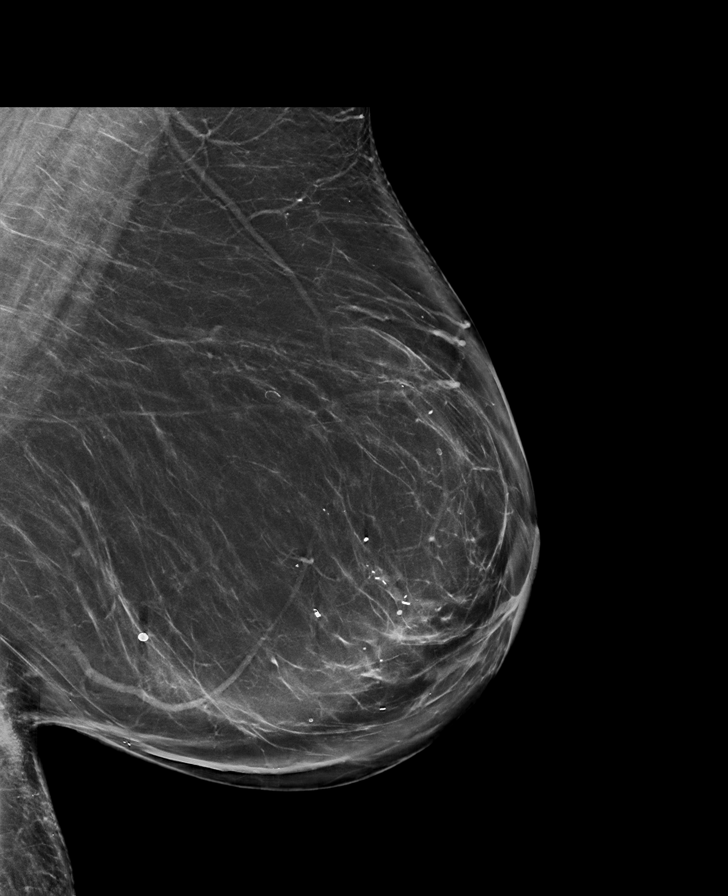

[R MLO synth-2D]
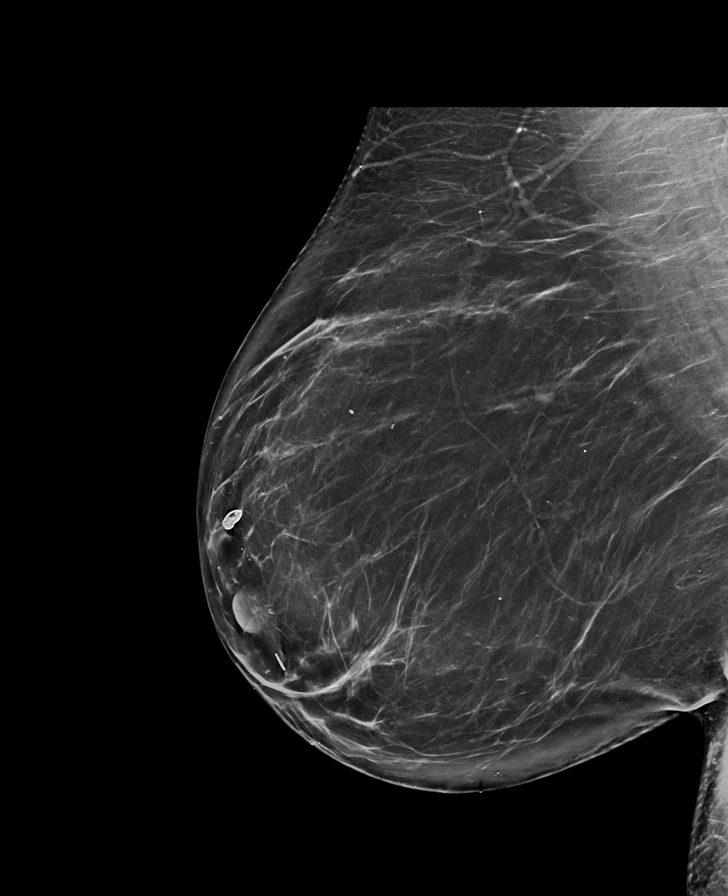

[R MLO tomo · tomo slice 45/89.0]
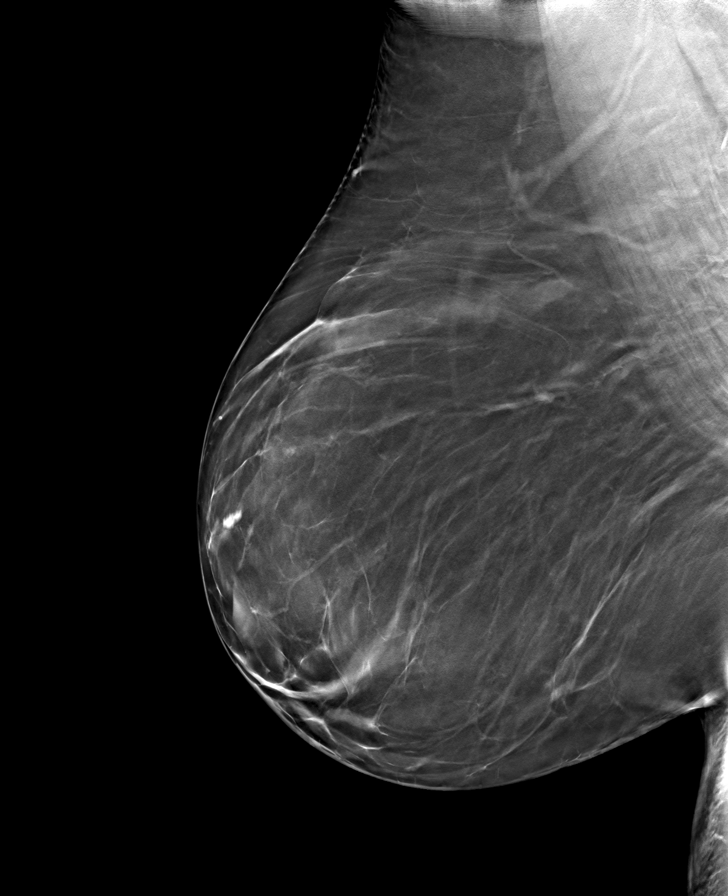

[R CC tomo · tomo slice 37/73.0]
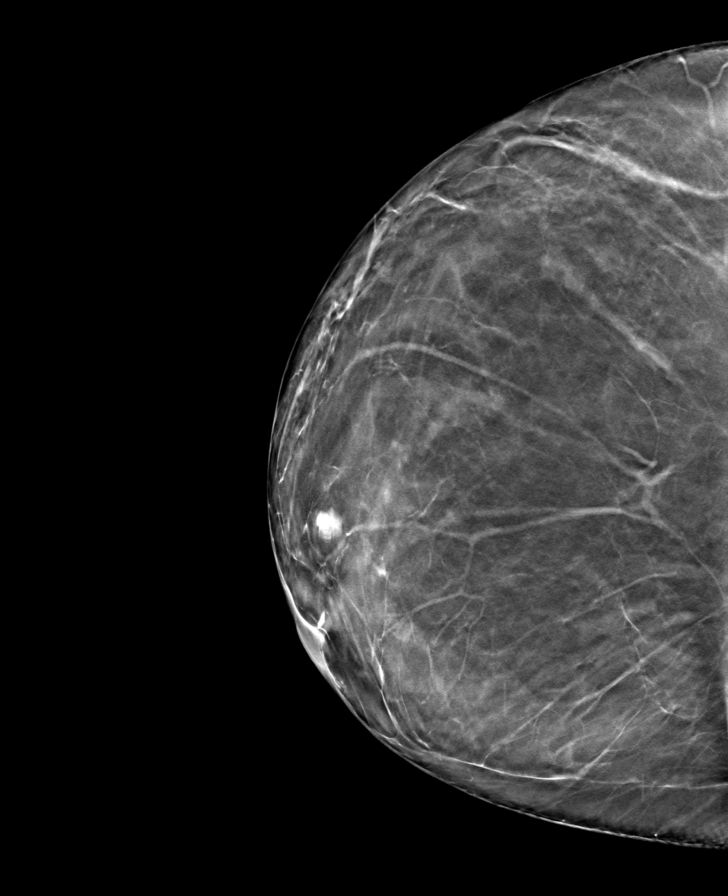

[L MLO tomo · tomo slice 45/90.0]
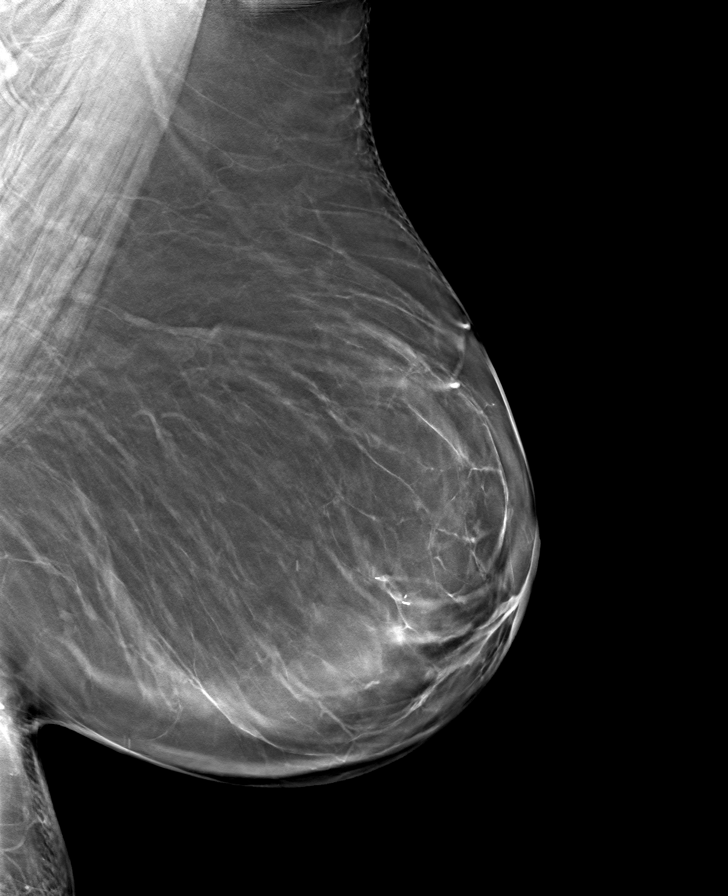

[L CC tomo · tomo slice 37/72.0]
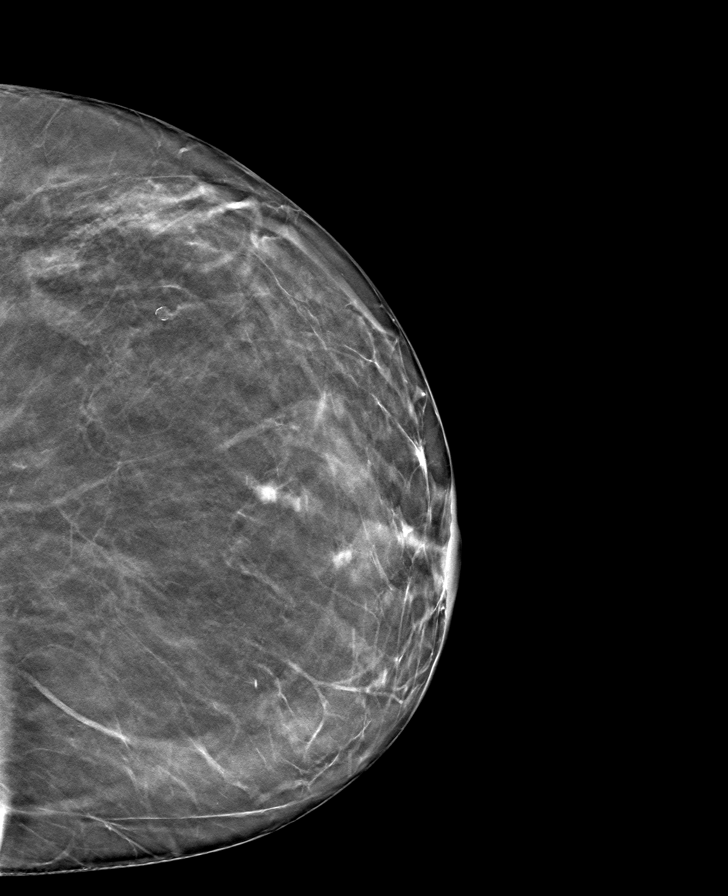

[8 of 24 positions shown; findings below may reference images not displayed]

ACR Breast Density Category b: There are scattered areas of
fibroglandular density.
FINDINGS: There are no findings suspicious for malignancy.
IMPRESSION: No mammographic evidence of malignancy. A result letter of this
screening mammogram will be mailed directly to the patient.

RECOMMENDATION:
Screening mammogram in one year. (Code:51-O-LD2)

BI-RADS CATEGORY  1: Negative.

## 2023-11-07 ENCOUNTER — Ambulatory Visit (INDEPENDENT_AMBULATORY_CARE_PROVIDER_SITE_OTHER)

## 2023-11-07 DIAGNOSIS — L603 Nail dystrophy: Secondary | ICD-10-CM

## 2023-11-07 NOTE — Progress Notes (Signed)
  Patient presents today for the maintenance laser treatment. Diagnosed with mycotic nail infection by Dr. Gaynel.    Toenail most affected 1st and fifth bilateral.   All other systems are negative. Nails are improving and growing out.    Nails were filed thin. Laser therapy was administered to 1-5 toenails bilaterally and patient tolerated the treatment well. All safety precautions were in place.      Follow up in 3 months for laser maintenance.

## 2023-11-18 ENCOUNTER — Ambulatory Visit (INDEPENDENT_AMBULATORY_CARE_PROVIDER_SITE_OTHER): Admitting: Podiatry

## 2023-11-18 VITALS — Ht 66.0 in | Wt 210.0 lb

## 2023-11-18 DIAGNOSIS — M2042 Other hammer toe(s) (acquired), left foot: Secondary | ICD-10-CM | POA: Diagnosis not present

## 2023-11-18 DIAGNOSIS — M21612 Bunion of left foot: Secondary | ICD-10-CM

## 2023-11-18 DIAGNOSIS — M21962 Unspecified acquired deformity of left lower leg: Secondary | ICD-10-CM

## 2023-11-18 DIAGNOSIS — M2012 Hallux valgus (acquired), left foot: Secondary | ICD-10-CM

## 2023-11-18 DIAGNOSIS — M21272 Flexion deformity, left ankle and toes: Secondary | ICD-10-CM

## 2023-11-18 NOTE — Patient Instructions (Signed)
                         Contains text generated by [employer]bridge.                                 Contains text generated by [employer]bridge.

## 2023-11-18 NOTE — Progress Notes (Signed)
 Subjective:  Patient ID: Donna Krause, female    DOB: 14-Sep-1960,  MRN: [id number]  Chief Complaint  Patient presents with   Consult    PATIENT Request a surgical consult for left foot bunion and hammetoe (left index).    Discussed the use of AI scribe software for clinical note transcription with the patient, who gave verbal consent to proceed.  History of Present Illness Donna Krause is a 63 year old female with diabetes who presents with concerns about a bunion and hammer toe.  She has had a bunion for a long time, which has recently worsened due to wearing new shoes, leading to the development of a hammer toe. The hammer toe is crossing over and causing discomfort as the knuckle rubs against her shoe. She is concerned about the potential for ulcer formation due to her diabetes.  She is starting to get pain from the hammertoe crossing over and rubbing on shoes. No issues with other toes.  Her diabetes is well-controlled, with her last check three months ago showing a level of around seven. She monitors her diabetes every six months and has not experienced any significantly high levels.  She has a long-standing issue with nail fungus. She previously underwent laser therapy, which initially improved the condition, but the fungus returned. She has restarted laser therapy, noting that the right toenail is responding better than the left. She has not had any toenails removed in the past.  Her two brothers have similar toe issues, which she attributes to family history.      Objective:    Physical Exam VASCULAR: DP and PT pulse palpable. Foot is warm and well-perfused. Capillary fill time is brisk. DERMATOLOGIC: Normal skin turgor, texture, and temperature. No open lesions, rashes, or ulcerations. NEUROLOGIC: Normal sensation to light touch and pressure. No paresthesias. ORTHOPEDIC: Smooth pain-free range of motion of all examined joints. No ecchymosis or bruising. No  gross deformity. No pain to palpation. Pes planus deformity on left foot with hypermobility of first tarsometatarsal joint. Dorsal medial bunion. Near crossover toe deformity of second toe with reducible flexion and contracture.    No images are attached to the encounter.    Results LABS Vitamin D: 50s A1c well-controlled per patient  RADIOLOGY Foot X-ray: Hallux valgus deformity, plantar gapping of the first tarsometatarsal joint, elongated second ray, second hammer toe contracture (06/12/2023)   Assessment:  No diagnosis found.   Plan:  Patient was evaluated and treated and all questions answered.  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  Left foot hallux valgus (bunion) The left foot has a dorsal medial bunion with good range of motion of the MTP joint and no pain. The bunion is associated with the pes planus deformity and contributes to overall foot instability. - Discuss surgical options including Lapidus procedure with bone graft from cadaver to correct bunion and stabilize medial longitudinal arch and first ray elevation. - Educate about surgical risks including stiffness, nerve damage, and recovery process involving non-weight bearing for a month followed by a walking boot for another month. 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 This condition is exacerbated by  the pes planus and hallux valgus, causing discomfort and potential for ulceration due to shoe friction. - Discuss surgical correction involving second metatarsal osteotomy and PIPJ  arthrodesis of toe, and placement of screws to straighten the toe. - Educate about surgical risks including stiffness and potential nerve complications.  Onychomycosis of bilateral toenails, left worse than right Chronic onychomycosis affects both toenails, with the left being more severe. Previous laser therapy was effective but the condition recurred. - Offer toenail removal in office with local anesthesia and antifungal treatment post-removal. - Discuss potential for soreness post-procedure and normal activity resumption. - Consider removal of both toenails simultaneously if desired.  Type 2 diabetes mellitus, well controlled Type 2 diabetes is well controlled with recent A1c around 7. - Regular monitoring every six months.     Surgical plan:  Procedure: - Left Lapidus with bone graft and BMA, second metatarsal osteotomy, second hammertoe correction, Akin osteotomy  Location: - [facility]  Anesthesia plan: - Sedation with regional block  Postoperative pain plan: - Tylenol 1000 mg every 6 hours, ibuprofen 600 mg every 8 hours, gabapentin 300 mg every 8 hours x5 days, oxycodone 5 mg 1-2 tabs every 6 hours only as needed  DVT prophylaxis: - ASA 320 mg twice daily  WB Restrictions / DME needs: - Nonweightbearing in CAM boot postop   Return for after surgery.

## 2023-11-28 ENCOUNTER — Telehealth: Payer: Self-pay | Admitting: Podiatry

## 2023-11-28 NOTE — Telephone Encounter (Signed)
 Received surgical consent forms   Left message for pt to call to schedule the surgery.

## 2023-12-01 ENCOUNTER — Ambulatory Visit (INDEPENDENT_AMBULATORY_CARE_PROVIDER_SITE_OTHER): Admitting: Podiatry

## 2023-12-01 ENCOUNTER — Telehealth: Payer: Self-pay | Admitting: Podiatry

## 2023-12-01 ENCOUNTER — Encounter: Payer: Self-pay | Admitting: Podiatry

## 2023-12-01 VITALS — Ht 66.0 in | Wt 210.0 lb

## 2023-12-01 DIAGNOSIS — M2042 Other hammer toe(s) (acquired), left foot: Secondary | ICD-10-CM

## 2023-12-01 DIAGNOSIS — M21612 Bunion of left foot: Secondary | ICD-10-CM

## 2023-12-01 DIAGNOSIS — M2012 Hallux valgus (acquired), left foot: Secondary | ICD-10-CM | POA: Diagnosis not present

## 2023-12-01 DIAGNOSIS — M21962 Unspecified acquired deformity of left lower leg: Secondary | ICD-10-CM | POA: Diagnosis not present

## 2023-12-01 NOTE — Progress Notes (Signed)
 Subjective:   Patient ID: Donna Krause, female   DOB: 63 y.o.   MRN: [id number]   HPI Patient presents stating she is interested in bunion surgery and is hoping to not have to have such an aggressive surgery done if possible.  States the second toe has really been bothering her and making it difficult to wear shoe gear with any degree of comfort and its gradually become more of an issue over the last recent time.  Patient has no other current issues A1c around 7.0   Review of Systems  All other systems reviewed and are negative.       Objective:  Physical Exam Vitals and nursing note reviewed.  Constitutional:      Appearance: She is well-developed.  Pulmonary:     Effort: Pulmonary effort is normal.  Musculoskeletal:        General: Normal range of motion.  Skin:    General: Skin is warm.  Neurological:     Mental Status: She is alert.     Neurovascular status found to be intact with patient having structural deformity left bunion deformity elevation of the second toe rigid contracture and moderate pathology around the second MPJ left with history of shoe gear accommodation history of cushioning of the area.     Assessment:  Significant HAV deformity left with redness with severe elevation second digit left compression against the second MPJ     Plan:  H&P reviewed at great length and discussed the x-rays at this time.  She is very leery about having a fusion surgery due to her diabetes and not wanting to be nonweightbearing and I did discuss distal osteotomy due to her flexibility of her feet long-term orthotics digital fusion and shortening osteotomy second left.  I explained this will probably not get her full correction but I think it will be in a range that we will work for her and allow her to be comfortable and wear shoe gear.  She would rather take this approach and I reviewed her consent form at great length going over alternative treatments complications and  patient scheduled for outpatient surgery with all questions answered today.  Patient understands everything is outlined and understands total recovery will take about 6 months and at this point she is dispensed air fracture walker that I want her to wear prior to surgery in order to get used to it and she will be contacted for the procedure

## 2023-12-01 NOTE — Telephone Encounter (Signed)
 Received surgical consent form.  Left message for pt to call to schedule her surgery.

## 2023-12-02 ENCOUNTER — Telehealth: Payer: Self-pay | Admitting: Podiatry

## 2023-12-02 NOTE — Telephone Encounter (Signed)
 Pt returned my call and is scheduled for surgery on  10/7. She is not on blood thinners but is on monjouro and is aware she has to stop that 1 wk prior to surgery.  She also takes contrave and is asking if she should stop that as well? Please advise.

## 2023-12-02 NOTE — Telephone Encounter (Signed)
 DOS- 12/23/2023  AUSTIN BUNIONECTOMY LT- 28296 2ND METATARSAL OSTEOTOMY LT- 28308 2ND HAMMERTOE REPAIR LT- 71714  [employer] EFFECTIVE DATE- 12/17/2022  DEDUCTIBLE- $750 REMAINING- $0 OOP- $4250 REMAINING- $2870.66 FAMILY DEDUCTIBLE- $1500 REMAINING- $196.99 FAMILY OOP- $8500 REMAINING- $3432.34 COINSURANCE- 20%  PER [employer] WEBSITE, NO PRIOR AUTHS ARE REQUIRED FOR CPT CODES 71703, [id number], AND 71714. DOCUMENTATION ATTACHED TO SURGERY CONSENT PACKET.

## 2023-12-03 NOTE — Telephone Encounter (Signed)
 Should not have to

## 2023-12-03 NOTE — Telephone Encounter (Signed)
 Notified pt per Dr Magdalen she should not have to stop the contrave medication. She said thank you.

## 2023-12-22 MED ORDER — OXYCODONE-ACETAMINOPHEN 10-325 MG PO TABS: 1.0000 | ORAL_TABLET | ORAL | 0 refills | Status: DC | PRN

## 2023-12-22 NOTE — Addendum Note (Signed)
 Addended by: MAGDALEN PASCO RAMAN on: 12/22/2023 05:03 PM   Modules accepted: Orders

## 2023-12-23 DIAGNOSIS — M2012 Hallux valgus (acquired), left foot: Secondary | ICD-10-CM | POA: Diagnosis not present

## 2023-12-23 DIAGNOSIS — M21542 Acquired clubfoot, left foot: Secondary | ICD-10-CM | POA: Diagnosis not present

## 2023-12-23 DIAGNOSIS — M2042 Other hammer toe(s) (acquired), left foot: Secondary | ICD-10-CM | POA: Diagnosis not present

## 2023-12-23 HISTORY — PX: BUNIONECTOMY: SHX129

## 2023-12-29 ENCOUNTER — Ambulatory Visit (INDEPENDENT_AMBULATORY_CARE_PROVIDER_SITE_OTHER)

## 2023-12-29 ENCOUNTER — Ambulatory Visit (INDEPENDENT_AMBULATORY_CARE_PROVIDER_SITE_OTHER): Admitting: Podiatry

## 2023-12-29 VITALS — BP 158/76 | HR 92 | Temp 98.1°F

## 2023-12-29 DIAGNOSIS — Z9889 Other specified postprocedural states: Secondary | ICD-10-CM

## 2023-12-29 NOTE — Progress Notes (Signed)
 Patient presents for post-op visit today, DOS 10/7 LT AUSTIN BUNIONECTOMY W/ FIXATION, LT 2ND HAMMERTOE FUSIONW PIN, FIXATION, LT 2ND SHORTENING OSTEOTOMY  Doing great. First night no pain, 2nd & 3rd she could feel it. Has some dorsiflex spasms around the 2nd & 3rd nights. Took one of the narcotics on the 3rd night. Only bothered be at night..  *Elevated BP: Patient reports normally high in office, has taken her meds this morning and denies symptoms.  Vital Signs: Today's Vitals   12/29/23 0834  BP: (!) 158/76  Pulse: 92  Temp: 98.1 F (36.7 C)  TempSrc: Oral  PainSc: 1   PainLoc: Toe    Radiographs: [x]  Taken []  Not taken  Surgical Site Assessment:  - Dressing:  [x]  Moderate dry blood, intact []  Reinforced   [x]  Changed     -RN Notes: dressing is adhered to incision.   - Incision:  []  CDI (clean, dry, intact)  [x]  Mild erythema  []  Drainage noted   -RN Notes: intact. Area at middle of incision on 3rd toe where bleeding is present after dressing removed. Same on 2nd toe.   - Swelling:  []  None  [x]  Mild  []  Moderate   []  Significant    - Bruising:  []  None  [x]  Present: ankle, 4th and 5th toe.   - Sutures/staples:  [x]  Intact  []  Removed Today  [x]  Plan to remove at next visit    -Cast/Splint/Pins: []  None [x]  Intact  []  Removed []  Plan to remove per provider instruction []  Replaced  -Signs of infection:  [x]  None  []  Present - Describe: n/a  -DME:    [x]  AFW []  Surgical shoe []  Cast  []  Splint  -Walking status:  [x]  Full WB  []  Partial WB  []  NWB    RN Notes: Patient reports walking on foot without AFW in place. Provider aware and patient educated.   -Utilizing device:  [x]  None []  Knee Scooter []  Crutches []  Wheelchair    DVT assessment:  [x]  Denies symptoms []  Chest pain/SOB []  Pain in calf/redness/warmth   Redressed DSD and ace wrap. Educated on signs of infection, proper dressing care, pain management, and weight bearing status. Patient will contact provider  with any new or worsening symptoms. The provider assessed the patient today and reviewed instructions regarding plan of care.

## 2023-12-29 NOTE — Progress Notes (Signed)
 Subjective:   Patient ID: Donna Krause, female   DOB: 63 y.o.   MRN: [id number]   HPI Patient presents stating overall been feeling well but she does admit that she walked without her shoe at night to go to the bathroom with no protection   ROS      Objective:  Physical Exam  Neurovascular status intact negative Toula' sign noted wound edges coapted well hallux in rectus position and alignment looks excellent from a clinical perspective with pin in place second toe     Assessment:  Overall clinically looks good but she did walk on it in a noncompliant fashion several times     Plan:  H&P x-rays reviewed.  I did discuss her with slight movement and I want her to stay completely immobilized at this time and pressure off the area.  She will be seen back again for stitch removal in approximately 2 weeks earlier if needed and will have x-rays at next visit to make sure nothing further is happening.  Patient knows she cannot walk without wearing her boot  X-rays indicate slight crack lying in the first metatarsal head left probably due to walking without any protection but fixation is in place and I am relatively confident it will hold at this position with no pathology

## 2023-12-29 NOTE — Patient Instructions (Signed)

## 2023-12-30 ENCOUNTER — Ambulatory Visit (HOSPITAL_BASED_OUTPATIENT_CLINIC_OR_DEPARTMENT_OTHER)
Admission: RE | Admit: 2023-12-30 | Discharge: 2023-12-30 | Disposition: A | Discharge status: Home / Self Care | Source: Ambulatory Visit | Attending: Obstetrics and Gynecology | Admitting: Obstetrics and Gynecology

## 2023-12-30 DIAGNOSIS — M8580 Other specified disorders of bone density and structure, unspecified site: Secondary | ICD-10-CM | POA: Insufficient documentation

## 2024-01-02 ENCOUNTER — Ambulatory Visit: Payer: Self-pay | Admitting: Obstetrics and Gynecology

## 2024-01-09 ENCOUNTER — Ambulatory Visit: Admitting: Podiatry

## 2024-01-09 ENCOUNTER — Ambulatory Visit (INDEPENDENT_AMBULATORY_CARE_PROVIDER_SITE_OTHER)

## 2024-01-09 ENCOUNTER — Encounter

## 2024-01-09 DIAGNOSIS — M21612 Bunion of left foot: Secondary | ICD-10-CM

## 2024-01-09 DIAGNOSIS — M2042 Other hammer toe(s) (acquired), left foot: Secondary | ICD-10-CM

## 2024-01-09 DIAGNOSIS — M2012 Hallux valgus (acquired), left foot: Secondary | ICD-10-CM

## 2024-01-09 NOTE — Progress Notes (Signed)
 Patient presents for post-op visit today, POV # 2 DOS 10/7 LT AUSTIN BUNIONECTOMY W/ FIXATION, LT 2ND HAMMERTOE FUSIONW PIN, FIXATION, LT 2ND SHORTENING OSTEOTOMY  Has been taking ibuprofen and Aleve to help with swelling. Has been staying off of the foot, only walking on heel if needed. Pain has been minimal. Keeping it elevated. Not driving except when needed..  RN Notes: n/a  Vital Signs: Today's Vitals   01/09/24 1210  PainSc: 0-No pain      Radiographs: [x]  Taken []  Not taken  Surgical Site Assessment:  - Dressing:  [x]  Minimal dry blood, intact []  Reinforced   []  Changed     -RN Notes: n/a  - Incision:  [x]  CDI (clean, dry, intact)  []  Mild erythema  []  Drainage noted   -RN Notes: some dry skin peeled away superficially at the proximal aspect on top of the foot. No active drainage or bleeding.  - Swelling:  []  None  [x]  Mild  []  Moderate   []  Significant     -RN Notes: n/a  - Bruising:  []  None  [x]  Present: top of foot near the surgical incision.    - Sutures/Staples:  []  None [x]  Intact  [x]  Removed Today  []  Plan to remove at next visit   -Cast/Splint/Pins: []  None [x]  Intact []  Removed Today [x]  Plan to remove at next visit []  Replaced  -Signs of infection:  [x]  None  []  Present - Describe: n/a  -DME:    []  None [x]  AFW []  Surgical shoe []  Cast  []  Splint    -RN Notes: Patient was given surgical shoe to transition into from AFW per provider instructions.   -Walking status:  []  Full WB  [x]  Partial WB  []  NWB  -Utilizing device:  [x]  None []  Knee Scooter []  Crutches []  Wheelchair    DVT assessment:  [x]  Denies symptoms []  Chest pain/SOB []  Pain in calf/redness/warmth   Redressed DSD and ace wrap. Educated on signs of infection, proper dressing care, pain management, and weight bearing status. Patient will contact provider with any new or worsening symptoms. The provider assessed the patient today and reviewed instructions regarding plan of care.

## 2024-01-10 NOTE — Progress Notes (Signed)
 Subjective:   Patient ID: Donna Krause, female   DOB: 63 y.o.   MRN: [id number]   HPI Patient is seen by nurse doing very well with foot surgery left   ROS      Objective:  Physical Exam  Neurovascular status intact negative Toula' sign noted foot looks excellent with stitches intact     Assessment:  Doing well read x-rays     Plan:  Stitches removed continue boot usage can slowly start to wear surgical shoe but continue complete immobilization reappoint 2 weeks pin removal  X-rays indicate there has been some stress on the first metatarsal head as patient did walk on this without immobilization but it has stayed stable and should not be a long-term problem

## 2024-01-12 ENCOUNTER — Encounter

## 2024-01-26 ENCOUNTER — Ambulatory Visit (INDEPENDENT_AMBULATORY_CARE_PROVIDER_SITE_OTHER)

## 2024-01-26 ENCOUNTER — Ambulatory Visit: Admitting: Podiatry

## 2024-01-26 ENCOUNTER — Encounter: Payer: Self-pay | Admitting: Podiatry

## 2024-01-26 DIAGNOSIS — M2012 Hallux valgus (acquired), left foot: Secondary | ICD-10-CM

## 2024-01-26 DIAGNOSIS — M21612 Bunion of left foot: Secondary | ICD-10-CM

## 2024-01-26 DIAGNOSIS — Z472 Encounter for removal of internal fixation device: Secondary | ICD-10-CM

## 2024-01-27 NOTE — Progress Notes (Signed)
 Subjective:   Patient ID: Donna Krause, female   DOB: 63 y.o.   MRN: [id number]   HPI Patient overall doing very well post bunion correction left states she is having minimal discomfort but does have some prominence in the first metatarsal shaft and did have some trauma to the first metatarsal postoperatively   ROS      Objective:  Physical Exam  Neurovascular status intact negative Toula' sign was noted left foot is healing well overall there is prominence of the proximal pin which will need to be removed at 1 point but we are trying to hold off and let it continue to be part of the healing with good range of motion no crepitus of the joint     Assessment:  Doing well structural correction left did have some trauma with some elevation of the first metatarsal segment but appears to be healing and there is no increased edema     Plan:  H&P x-rays reviewed discussed I did discuss pin removal patient wants this done but will get a hold off for at least the next 6 to 8 weeks.  Patient will be seen back 4 weeks to review and we will decide when the appropriate time will be to take care of this condition.  Patient will continue immobilization can gradually start to wear rigid bottom shoes over the next couple weeks  X-rays were consistent that the osteotomy did have a small fracture line from trauma but it is stable in its current position and the proximal pin did back up and will need removal at 1 point

## 2024-02-06 ENCOUNTER — Ambulatory Visit (INDEPENDENT_AMBULATORY_CARE_PROVIDER_SITE_OTHER): Payer: Self-pay | Admitting: Podiatrist

## 2024-02-06 DIAGNOSIS — B351 Tinea unguium: Secondary | ICD-10-CM

## 2024-02-06 NOTE — Progress Notes (Signed)
 Donna Krause presents today for the laser maintenance. Diagnosed with mycotic nail infection by Dr. Gaynel.    Toenail most affected 1st bilateral.   She had bunion and hammertoe 2/3 surgery on the left foot by Dr. Magdalen on 12/23/2023.  States overall she is well-  is walking comfortably in her brooks running shoes.     All other systems are negative.    Toe nails were trimmed back and  filed thin. Laser therapy was administered to 1-5 toenails bilateral and patient tolerated the treatment well. All safety precautions were in place.    Patient will follow up in 3 months for laser maintenance.

## 2024-02-10 ENCOUNTER — Other Ambulatory Visit

## 2024-02-23 ENCOUNTER — Ambulatory Visit (INDEPENDENT_AMBULATORY_CARE_PROVIDER_SITE_OTHER)

## 2024-02-23 ENCOUNTER — Ambulatory Visit (INDEPENDENT_AMBULATORY_CARE_PROVIDER_SITE_OTHER): Admitting: Podiatry

## 2024-02-23 DIAGNOSIS — M21612 Bunion of left foot: Secondary | ICD-10-CM

## 2024-02-23 DIAGNOSIS — M2012 Hallux valgus (acquired), left foot: Secondary | ICD-10-CM | POA: Diagnosis not present

## 2024-02-23 DIAGNOSIS — Z472 Encounter for removal of internal fixation device: Secondary | ICD-10-CM

## 2024-02-24 NOTE — Progress Notes (Signed)
 Subjective:   Patient ID: Donna Krause, female   DOB: 63 y.o.   MRN: [id number]   HPI Patient states that she needs to get this pin out but states that wearing the padding has been helpful   ROS      Objective:  Physical Exam  Neurovascular status intact with a prominent proximal pin in the left with well-healed surgical sites from previous surgery that was performed     Assessment:  Overall doing well but does have a pin that backed out proximal will need to be removed     Plan:  H&P allowed her to read a consent form for removal of pin going over all possible complications everything is outlined she is willing to except this procedure signed consent form scheduled for this to be done in the office in the next 3 to 4 weeks and will continue padding and cushioning until that time.  X-rays dated today did indicate that the pin has backed out slightly further she probably had trauma but overall the osteotomy is healing well slight dorsal movement but stable

## 2024-03-29 ENCOUNTER — Encounter: Payer: Self-pay | Admitting: Podiatry

## 2024-03-29 ENCOUNTER — Ambulatory Visit (INDEPENDENT_AMBULATORY_CARE_PROVIDER_SITE_OTHER): Admitting: Podiatry

## 2024-03-29 ENCOUNTER — Ambulatory Visit (INDEPENDENT_AMBULATORY_CARE_PROVIDER_SITE_OTHER)

## 2024-03-29 DIAGNOSIS — M21612 Bunion of left foot: Secondary | ICD-10-CM

## 2024-03-29 DIAGNOSIS — M2012 Hallux valgus (acquired), left foot: Secondary | ICD-10-CM

## 2024-03-29 DIAGNOSIS — Z472 Encounter for removal of internal fixation device: Secondary | ICD-10-CM

## 2024-03-29 NOTE — Progress Notes (Signed)
 Subjective:   Patient ID: Donna Krause, female   DOB: 64 y.o.   MRN: [id number]   HPI Patient states she has been doing pretty well but she wants to get the pin out of the left first metatarsal   ROS      Objective:  Physical Exam  Neuro vascular status intact negative Toula' sign noted wound edges well coapted hallux in rectus position good range of motion mild edema underneath the second metatarsal but not abnormal with prominent pin position proximal left     Assessment:  History of bone movement with proximal pin that is moving out left     Plan:  H&P took new x-rays reviewed there is been no further movement of bone it appears to be stable and I think pin can be removed at this time.  I allowed her to read a consent form going over all possible complications with pin removal and she signed and is scheduled to have pin removal done in the office with all questions answered today

## 2024-04-13 ENCOUNTER — Other Ambulatory Visit

## 2024-04-14 ENCOUNTER — Telehealth: Payer: Self-pay | Admitting: Podiatry

## 2024-04-14 NOTE — Telephone Encounter (Signed)
 Called and patient is scheduled for office surgery on 04/28/2024. Patient aware arrival time of 7:15. Patient pharmacy correct in chart.

## 2024-04-15 ENCOUNTER — Telehealth: Payer: Self-pay | Admitting: Podiatry

## 2024-04-15 NOTE — Telephone Encounter (Signed)
 DOS- 04/28/2024  REMOVAL FIXATION DEEP KWIRE/SCREW LT- 20680  [employer] EFFECTIVE DATE- 02/16/2024  DEDUCTIBLE- $150 REMAINING- $0 OOP- $1200 REMAINING- $8951.36 COINSURANCE- 0%  PER [employer] PORTAL, PRIOR AUTH IS NOT REQUIRED FOR CPT CODE 79319.

## 2024-04-28 ENCOUNTER — Ambulatory Visit: Admitting: Podiatry

## 2024-05-07 ENCOUNTER — Ambulatory Visit

## 2024-05-12 ENCOUNTER — Encounter: Admitting: Podiatry

## 2024-07-28 ENCOUNTER — Ambulatory Visit: Admitting: Obstetrics and Gynecology
